# Patient Record
Sex: Male | Born: 1965 | Race: White | Hispanic: No | Marital: Married | Smoking: Never smoker
Health system: Southern US, Community
[De-identification: ages and names within clinical notes are randomized; demographics above are authoritative.]

## PROBLEM LIST (undated history)

## (undated) DIAGNOSIS — K4090 Unilateral inguinal hernia, without obstruction or gangrene, not specified as recurrent: Secondary | ICD-10-CM

## (undated) DIAGNOSIS — K219 Gastro-esophageal reflux disease without esophagitis: Secondary | ICD-10-CM

## (undated) DIAGNOSIS — C4491 Basal cell carcinoma of skin, unspecified: Secondary | ICD-10-CM

## (undated) DIAGNOSIS — J45909 Unspecified asthma, uncomplicated: Secondary | ICD-10-CM

## (undated) DIAGNOSIS — Z9889 Other specified postprocedural states: Secondary | ICD-10-CM

## (undated) DIAGNOSIS — G5603 Carpal tunnel syndrome, bilateral upper limbs: Secondary | ICD-10-CM

## (undated) DIAGNOSIS — I10 Essential (primary) hypertension: Secondary | ICD-10-CM

## (undated) DIAGNOSIS — J189 Pneumonia, unspecified organism: Secondary | ICD-10-CM

## (undated) HISTORY — DX: Basal cell carcinoma of skin, unspecified: C44.91

## (undated) HISTORY — PX: COLONOSCOPY: SHX174

## (undated) HISTORY — PX: SKIN CANCER EXCISION: SHX779

## (undated) HISTORY — PX: CATARACT EXTRACTION: SUR2

## (undated) HISTORY — PX: HERNIA REPAIR: SHX51

## (undated) HISTORY — DX: Other specified postprocedural states: Z98.890

## (undated) HISTORY — DX: Unilateral inguinal hernia, without obstruction or gangrene, not specified as recurrent: K40.90

---

## 2018-10-30 DIAGNOSIS — U071 COVID-19: Secondary | ICD-10-CM

## 2018-10-30 HISTORY — DX: COVID-19: U07.1

## 2019-05-06 ENCOUNTER — Encounter: Payer: Self-pay | Admitting: Emergency Medicine

## 2019-05-06 ENCOUNTER — Other Ambulatory Visit: Payer: Self-pay

## 2019-05-06 DIAGNOSIS — R55 Syncope and collapse: Secondary | ICD-10-CM | POA: Diagnosis present

## 2019-05-06 DIAGNOSIS — I10 Essential (primary) hypertension: Secondary | ICD-10-CM | POA: Diagnosis not present

## 2019-05-06 DIAGNOSIS — Z85828 Personal history of other malignant neoplasm of skin: Secondary | ICD-10-CM | POA: Insufficient documentation

## 2019-05-06 DIAGNOSIS — J45909 Unspecified asthma, uncomplicated: Secondary | ICD-10-CM | POA: Diagnosis not present

## 2019-05-06 DIAGNOSIS — M545 Low back pain: Secondary | ICD-10-CM | POA: Diagnosis not present

## 2019-05-06 LAB — URINALYSIS, COMPLETE (UACMP) WITH MICROSCOPIC
Bacteria, UA: NONE SEEN
Bilirubin Urine: NEGATIVE
Glucose, UA: NEGATIVE mg/dL
Hgb urine dipstick: NEGATIVE
Ketones, ur: NEGATIVE mg/dL
Leukocytes,Ua: NEGATIVE
Nitrite: NEGATIVE
Protein, ur: 30 mg/dL — AB
Specific Gravity, Urine: 1.021 (ref 1.005–1.030)
Squamous Epithelial / HPF: NONE SEEN (ref 0–5)
pH: 7 (ref 5.0–8.0)

## 2019-05-06 LAB — BASIC METABOLIC PANEL
Anion gap: 8 (ref 5–15)
BUN: 15 mg/dL (ref 6–20)
CO2: 28 mmol/L (ref 22–32)
Calcium: 9.2 mg/dL (ref 8.9–10.3)
Chloride: 106 mmol/L (ref 98–111)
Creatinine, Ser: 1.2 mg/dL (ref 0.61–1.24)
GFR calc Af Amer: >60 mL/min (ref >60)
GFR calc non Af Amer: >60 mL/min (ref >60)
Glucose, Bld: 102 mg/dL — ABNORMAL HIGH (ref 70–99)
Potassium: 4.1 mmol/L (ref 3.5–5.1)
Sodium: 142 mmol/L (ref 135–145)

## 2019-05-06 LAB — CBC
HCT: 44.5 % (ref 39.0–52.0)
Hemoglobin: 14.8 g/dL (ref 13.0–17.0)
MCH: 29.2 pg (ref 26.0–34.0)
MCHC: 33.3 g/dL (ref 30.0–36.0)
MCV: 87.9 fL (ref 80.0–100.0)
Platelets: 254 10*3/uL (ref 150–400)
RBC: 5.06 MIL/uL (ref 4.22–5.81)
RDW: 12.5 % (ref 11.5–15.5)
WBC: 15.2 10*3/uL — ABNORMAL HIGH (ref 4.0–10.5)
nRBC: 0 % (ref 0.0–0.2)

## 2019-05-06 LAB — TROPONIN I (HIGH SENSITIVITY): Troponin I (High Sensitivity): 3 ng/L (ref <18)

## 2019-05-06 NOTE — ED Triage Notes (Addendum)
Patient to ER for c/o lower back pain as result of fall with syncopal episode at approx 1730. Patient had scab on face from cancer area removal that he peeled off, then had syncopal episode. Patient reports h/o back pain, but states it is much more now after fall. Patient denies hitting head at time of incident. Denies any neck or head pain. Denies any pain other than lower back. Patient states he did have some chest pain just prior to syncopal episode, none currently. Denies any dizziness.

## 2019-05-07 ENCOUNTER — Emergency Department: Payer: Managed Care, Other (non HMO)

## 2019-05-07 ENCOUNTER — Emergency Department
Admission: EM | Admit: 2019-05-07 | Discharge: 2019-05-07 | Disposition: A | Discharge status: Home / Self Care | Payer: Managed Care, Other (non HMO) | Attending: Emergency Medicine | Admitting: Emergency Medicine

## 2019-05-07 DIAGNOSIS — M545 Low back pain, unspecified: Secondary | ICD-10-CM

## 2019-05-07 DIAGNOSIS — R55 Syncope and collapse: Secondary | ICD-10-CM

## 2019-05-07 HISTORY — DX: Gastro-esophageal reflux disease without esophagitis: K21.9

## 2019-05-07 HISTORY — DX: Essential (primary) hypertension: I10

## 2019-05-07 HISTORY — DX: Unspecified asthma, uncomplicated: J45.909

## 2019-05-07 MED ORDER — DIAZEPAM 2 MG PO TABS
2.0000 mg | ORAL_TABLET | Freq: Once | ORAL | Status: AC
  Administered 2019-05-07: 03:00:00 2 mg via ORAL
  Filled 2019-05-07 (×2): qty 1

## 2019-05-07 MED ORDER — IBUPROFEN 800 MG PO TABS: 800.0000 mg | ORAL_TABLET | Freq: Three times a day (TID) | ORAL | 0 refills | Status: DC | PRN

## 2019-05-07 MED ORDER — ONDANSETRON HCL 4 MG/2ML IJ SOLN
4.0000 mg | Freq: Once | INTRAMUSCULAR | Status: AC
  Administered 2019-05-07: 4 mg via INTRAVENOUS
  Filled 2019-05-07 (×2): qty 2

## 2019-05-07 MED ORDER — DIAZEPAM 2 MG PO TABS: 2.0000 mg | ORAL_TABLET | Freq: Three times a day (TID) | ORAL | 0 refills | Status: DC | PRN

## 2019-05-07 MED ORDER — OXYCODONE-ACETAMINOPHEN 5-325 MG PO TABS: 1.0000 | ORAL_TABLET | ORAL | 0 refills | Status: DC | PRN

## 2019-05-07 MED ORDER — SODIUM CHLORIDE 0.9 % IV BOLUS
1000.0000 mL | Freq: Once | INTRAVENOUS | Status: AC
  Administered 2019-05-07: 03:00:00 1000 mL via INTRAVENOUS

## 2019-05-07 MED ORDER — FENTANYL CITRATE (PF) 100 MCG/2ML IJ SOLN
50.0000 ug | Freq: Once | INTRAMUSCULAR | Status: AC
  Administered 2019-05-07: 03:00:00 50 ug via INTRAVENOUS
  Filled 2019-05-07 (×2): qty 2

## 2019-05-07 NOTE — ED Provider Notes (Signed)
[facility] Emergency Department Provider Note   ____________________________________________   First MD Initiated Contact with Patient 05/07/19 0231     (approximate)  I have reviewed the triage vital signs and the nursing notes.   HISTORY  Chief Complaint Loss of Consciousness    HPI Dennis JumpGregory Knapp is a 53 y.o. male who presents to the ED from home with a chief complaint of syncope/fall with lower back pain.  Patient has a history of hypertension, GERD, asthma who had Mohs surgery to his left face 7/6.  He had finished showering and was attempting to shave and remove his facial bandage when he felt dizzy, sat on the commode and had a syncopal episode.  Girlfriend heard him fall and found him on the restroom floor.  Patient denies striking his head at the time.  Initially did state he had some chest pain prior to the syncopal episode but has not had any since.  Only complains of low back pain.  Reports history of back pain but states low back hurts acutely.  Denies fever, cough, shortness of breath, abdominal pain, nausea, vomiting or dizziness.  Denies anticoagulant use.  Denies recent travel or exposure to persons diagnosed with coronavirus.       Past Medical History:  Diagnosis Date  . Asthma   . GERD (gastroesophageal reflux disease)   . Hypertension     There are no active problems to display for this patient.   Past Surgical History:  Procedure Laterality Date  . CATARACT EXTRACTION Right   . HERNIA REPAIR    . SKIN CANCER EXCISION     nose    Prior to Admission medications   Not on File    Allergies Patient has no known allergies.  No family history on file.  Social History Social History   Tobacco Use  . Smoking status: Never Smoker  . Smokeless tobacco: Never Used  Substance Use Topics  . Alcohol use: Yes    Comment: rarely  . Drug use: Not on file    Review of Systems  Constitutional: No fever/chills Eyes: No visual  changes. ENT: No sore throat. Cardiovascular: Positive for chest pain. Respiratory: Denies shortness of breath. Gastrointestinal: No abdominal pain.  No nausea, no vomiting.  No diarrhea.  No constipation. Genitourinary: Negative for dysuria. Musculoskeletal: Positive for for back pain. Skin: Negative for rash. Neurological: Positive for syncope.  Negative for headaches, focal weakness or numbness.   ____________________________________________   PHYSICAL EXAM:  VITAL SIGNS: ED Triage Vitals  Enc Vitals Group     BP 05/06/19 1945 126/85     Pulse Rate 05/06/19 1945 68     Resp 05/06/19 1945 18     Temp 05/06/19 1945 99.4 F (37.4 C)     Temp Source 05/06/19 1945 Oral     SpO2 05/06/19 1945 98 %     Weight 05/06/19 1940 187 lb (84.8 kg)     Height 05/06/19 1940 5\' 8"  (1.727 m)     Head Circumference --      Peak Flow --      Pain Score 05/06/19 1939 7     Pain Loc --      Pain Edu? --      Excl. in GC? --     Constitutional: Alert and oriented. Well appearing and in no acute distress. Eyes: Conjunctivae are normal. PERRL. EOMI. Head: Atraumatic.  Left periorbital and cheek bruising status post Mohs procedure. Nose: Atraumatic. Mouth/Throat: Mucous membranes  are moist.  No dental malocclusion. Neck: No stridor.  No cervical spine tenderness to palpation.  No carotid bruits. Cardiovascular: Normal rate, regular rhythm. Grossly normal heart sounds.  Good peripheral circulation. Respiratory: Normal respiratory effort.  No retractions. Lungs CTAB. Gastrointestinal: Soft and nontender. No distention. No abdominal bruits. No CVA tenderness. Musculoskeletal: Lumbar spine tender to palpation with her spinal muscle spasms.  Limited range of motion secondary to pain.  No lower extremity tenderness nor edema.  No joint effusions. Neurologic:  Normal speech and language. No gross focal neurologic deficits are appreciated.  Skin:  Skin is warm, dry and intact. No rash noted.  Psychiatric: Mood and affect are normal. Speech and behavior are normal.  ____________________________________________   LABS (all labs ordered are listed, but only abnormal results are displayed)  Labs Reviewed  BASIC METABOLIC PANEL - Abnormal; Notable for the following components:      Result Value   Glucose, Bld 102 (*)    All other components within normal limits  CBC - Abnormal; Notable for the following components:   WBC 15.2 (*)    All other components within normal limits  URINALYSIS, COMPLETE (UACMP) WITH MICROSCOPIC - Abnormal; Notable for the following components:   Color, Urine YELLOW (*)    APPearance HAZY (*)    Protein, ur 30 (*)    All other components within normal limits  TROPONIN I (HIGH SENSITIVITY)   ____________________________________________  EKG  ED ECG REPORT I, Kelon Easom J, the attending physician, personally viewed and interpreted this ECG.   Date: 05/07/2019  EKG Time: 1940  Rate: 63  Rhythm: normal EKG, normal sinus rhythm  Axis: Normal  Intervals:none  ST&T Change: Nonspecific  ____________________________________________  RADIOLOGY  ED MD interpretation: No ICH, degenerative change in lumbar spine without fracture  Official radiology report(s): Dg Lumbar Spine Complete  Result Date: 05/07/2019 CLINICAL DATA:  Lumbosacral back pain after fall. EXAM: LUMBAR SPINE - COMPLETE 4+ VIEW COMPARISON:  None. FINDINGS: Minimal retrolisthesis of L4 on L5, likely degenerative. Alignment is otherwise maintained. Vertebral body heights are normal. The posterior elements are intact. Multilevel endplate spurring. Mild disc space narrowing at L1-L2 and L2-L3. Facet hypertrophy in the lower lumbar spine. No fracture. Sacroiliac joints are congruent. Mild sclerosis about left sacroiliac joint. Prior hernia repair. 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 Electronically Signed   By: Narda RutherfordMelanie  Sanford M.D.   On: 05/07/2019  02:54   Ct Head Wo Contrast  Result Date: 05/07/2019 CLINICAL DATA:  Syncope. Fall. EXAM: CT HEAD WITHOUT CONTRAST TECHNIQUE: Contiguous axial images were obtained from the base of the skull through the vertex without intravenous contrast. COMPARISON:  None. FINDINGS: Brain: No intracranial hemorrhage, mass effect, or midline shift. No hydrocephalus. The basilar cisterns are patent. No evidence of territorial infarct or acute ischemia. No extra-axial or intracranial fluid collection. Vascular: Hyperdense vessel. Skull: No fracture or focal lesion. Sinuses/Orbits: Paranasal sinuses and mastoid air cells are clear. The visualized orbits are unremarkable. Other: None. IMPRESSION: Negative head CT. Electronically Signed   By: Narda RutherfordMelanie  Sanford M.D.   On: 05/07/2019 03:05    ____________________________________________   PROCEDURES  Procedure(s) performed (including Critical Care):  Procedures   ____________________________________________   INITIAL IMPRESSION / ASSESSMENT AND PLAN / ED COURSE  As part of my medical decision making, I reviewed the following data within the electronic MEDICAL RECORD NUMBER Nursing notes reviewed and incorporated, Labs reviewed, EKG interpreted, Old chart reviewed, Radiograph reviewed and Notes from prior ED  visits     Daylin Gruszka was evaluated in Emergency Department on 05/07/2019 for the symptoms described in the history of present illness. He was evaluated in the context of the global COVID-19 pandemic, which necessitated consideration that the patient might be at risk for infection with the SARS-CoV-2 virus that causes COVID-19. Institutional protocols and algorithms that pertain to the evaluation of patients at risk for COVID-19 are in a state of rapid change based on information released by regulatory bodies including the CDC and federal and state organizations. These policies and algorithms were followed during the patient's care in the ED.   53 year old male who  presents status post syncopal episode.  Differential diagnosis includes but is not limited to [place], CVA, vasovagal, infectious, metabolic etiologies, etc.  Laboratory and urine results noted. Will obtain CT Head and LS xrays. Initiate IV fluids and analgesia.   Clinical Course as of May 06 352  Wed May 07, 2019  0344 Updated patient of imaging results.  He is currently sitting up, eating a sandwich tray and feeling overall much better.  Will discharge home on NSAIDs, analgesia and muscle relaxer.  Strict return precautions given.  Patient verbalizes understanding and agrees with plan of care.   [JS]    Clinical Course User Index [JS] Paulette Blanch, MD     ____________________________________________   FINAL CLINICAL IMPRESSION(S) / ED DIAGNOSES  Final diagnoses:  Syncope, unspecified syncope type  Acute midline low back pain without sciatica     ED Discharge Orders    None       Note:  This document was prepared using Dragon voice recognition software and may include unintentional dictation errors.   Paulette Blanch, MD 05/07/19 [phone]

## 2019-05-07 NOTE — Discharge Instructions (Addendum)
You may take medicines as needed for pain & muscle spasms (Motrin/Percocet/Valium #15). Apply moist heat to affected area several times daily. Return to the ER for worsening symptoms, persistent vomiting, difficulty breathing or other concerns.

## 2020-05-22 IMAGING — CT CT HEAD WITHOUT CONTRAST
3 series · 16 of 46 positions shown, 19 images · non-contrast
Comparison: None.

CLINICAL DATA: Syncope. Fall.

EXAM:
CT HEAD WITHOUT CONTRAST
TECHNIQUE: Contiguous axial images were obtained from the base of the skull
through the vertex without intravenous contrast.

[Series 2: head wo · axial · 0.40mm/px · z∈[-85,+35]mm · 10 of 29 slices shown, 13 images]
[im 3/29  brain]
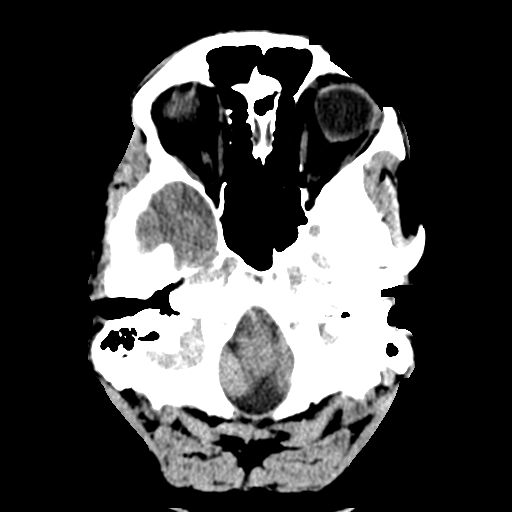
[im 3/29  bone]
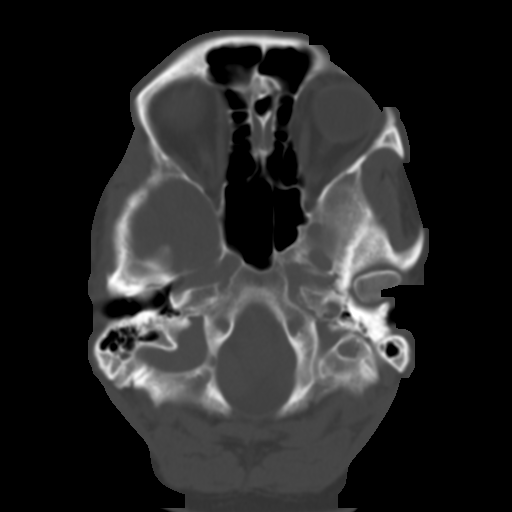
[im 6/29  brain]
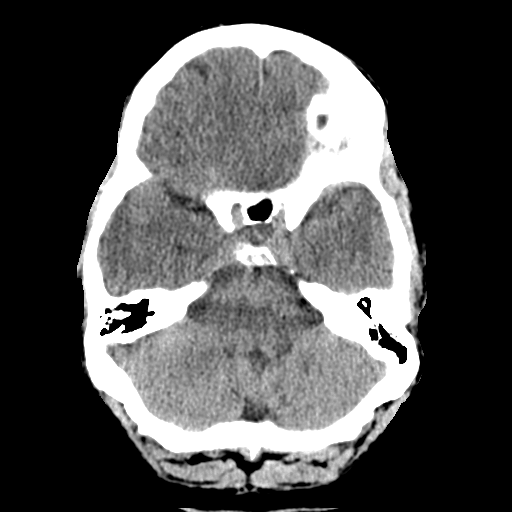
[im 8/29  brain]
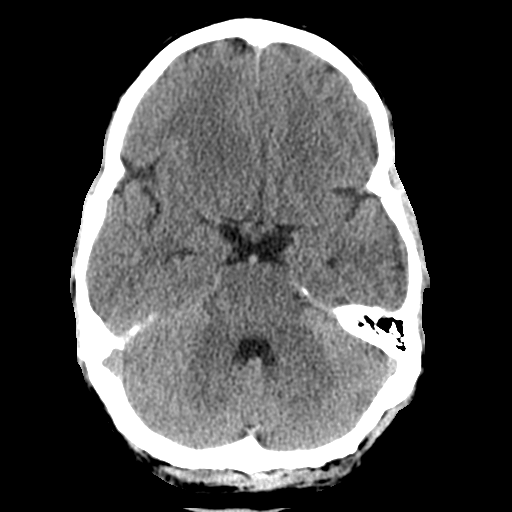
[im 11/29  brain]
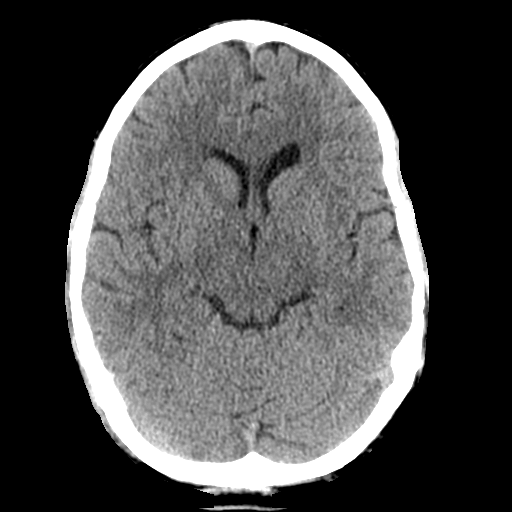
[im 14/29  brain]
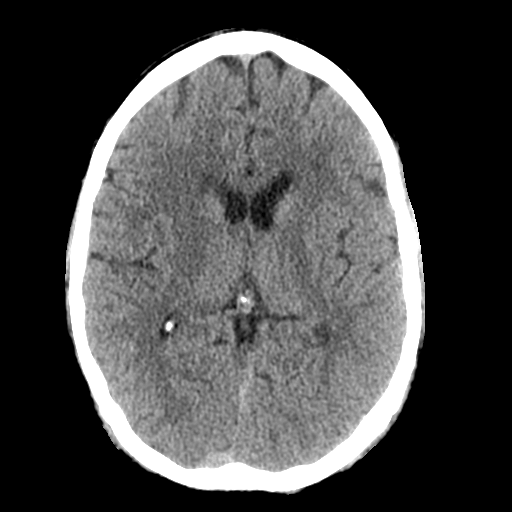
[im 14/29  bone]
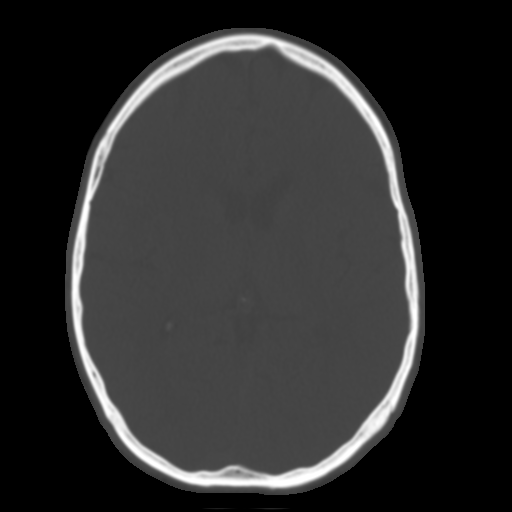
[im 16/29  brain]
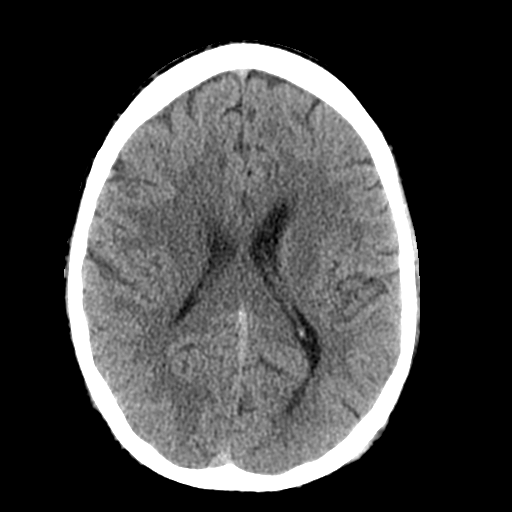
[im 19/29  brain]
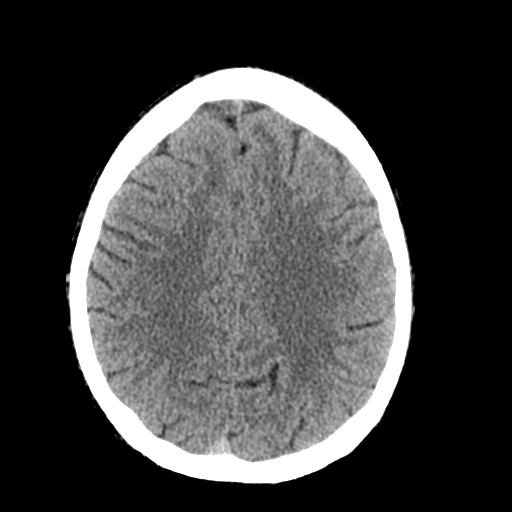
[im 22/29  brain]
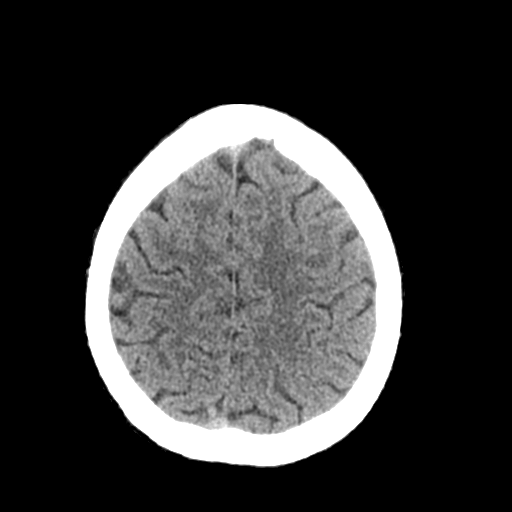
[im 24/29  brain]
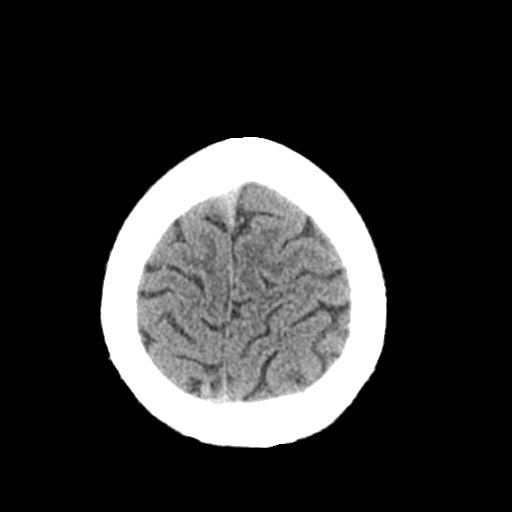
[im 24/29  bone]
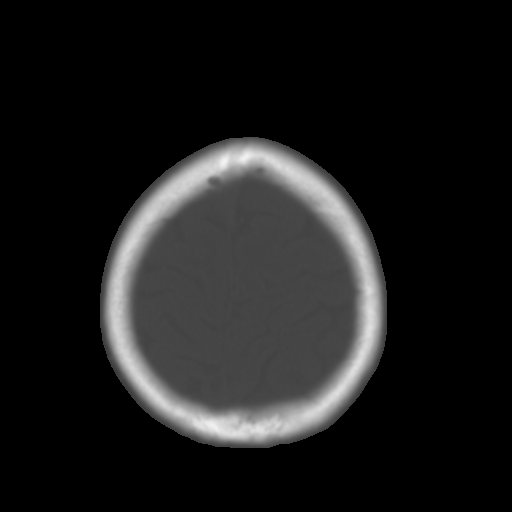
[im 27/29  brain]
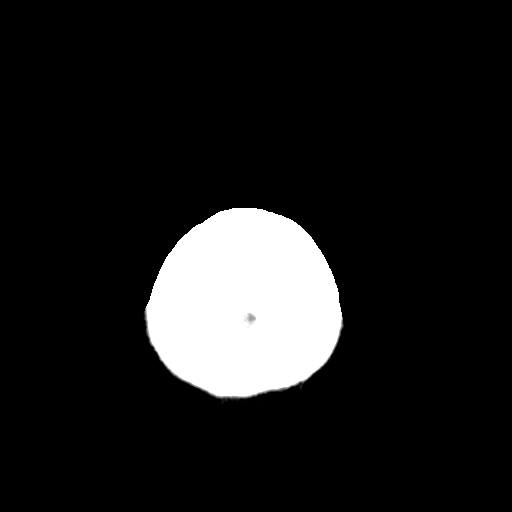

[Series 4: coronal soft tissue · coronal · 0.29mm/px · 3 of 64 slices shown]
[im 22/64  brain]
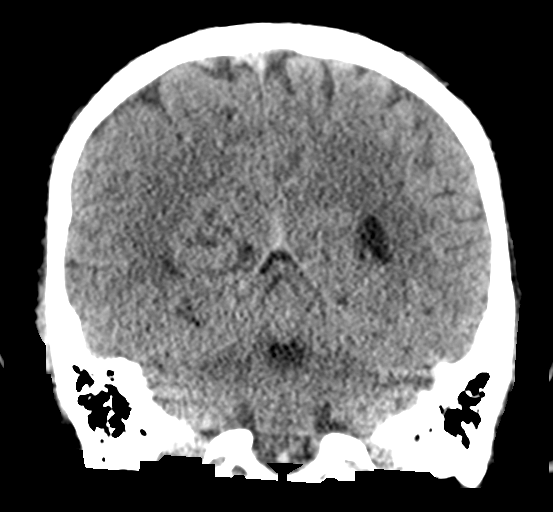
[im 29/64  brain]
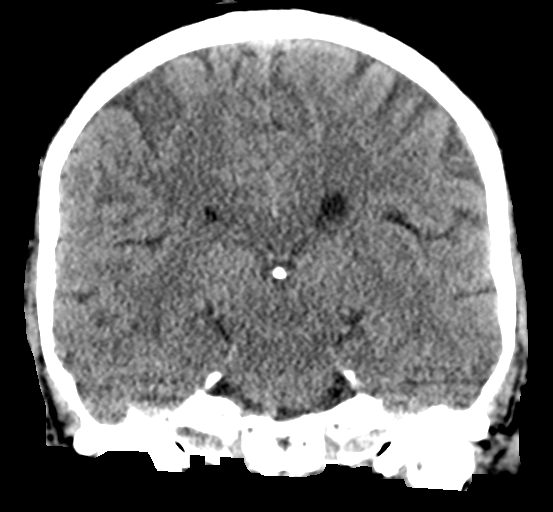
[im 36/64  brain]
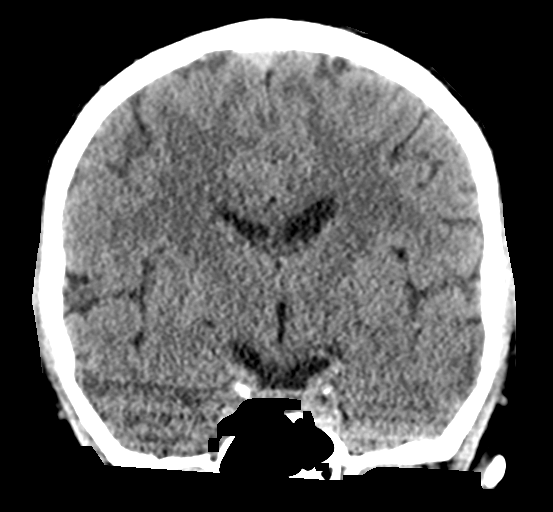

[Series 5: sagittal soft tissue · sagittal · 0.29mm/px · 3 of 50 slices shown]
[im 17/50  brain]
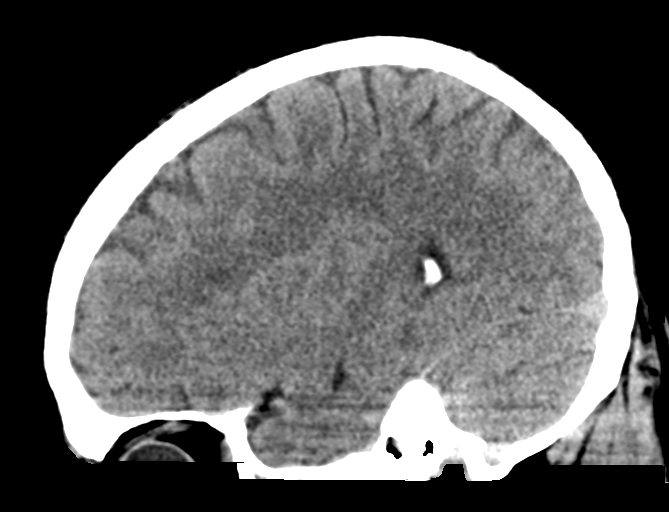
[im 25/50  brain]
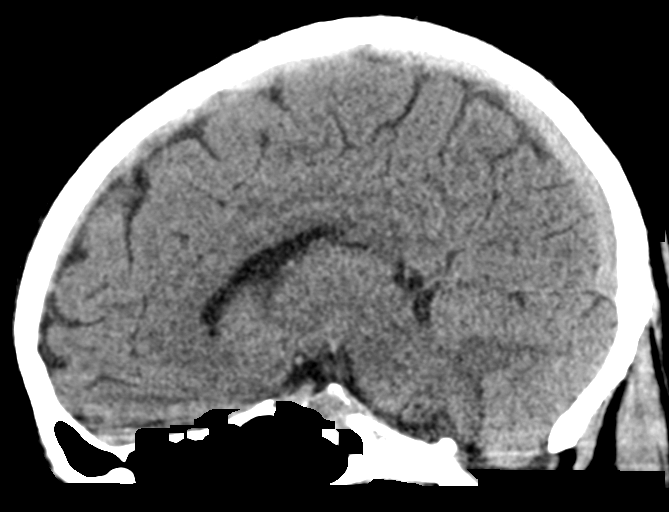
[im 33/50  brain]
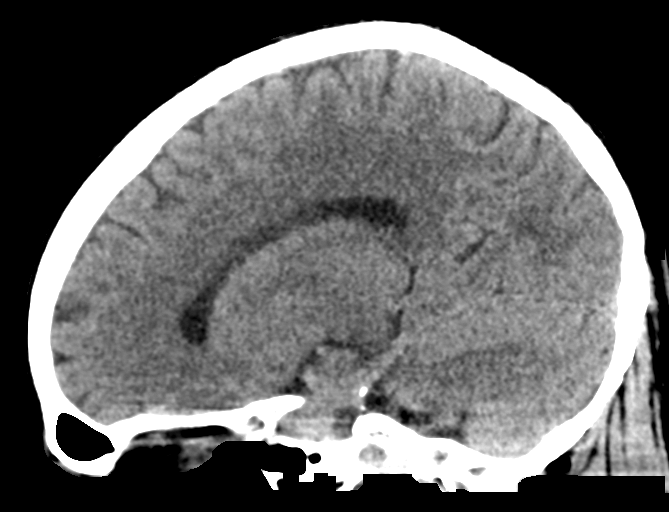

[16 of 46 positions shown; findings below may reference images not displayed]

FINDINGS: Brain: No intracranial hemorrhage, mass effect, or midline shift. No
hydrocephalus. The basilar cisterns are patent. No evidence of
territorial infarct or acute ischemia. No extra-axial or
intracranial fluid collection.

Vascular: Hyperdense vessel.

Skull: No fracture or focal lesion.

Sinuses/Orbits: Paranasal sinuses and mastoid air cells are clear.
The visualized orbits are unremarkable.

Other: None.
IMPRESSION: Negative head CT.

## 2021-04-13 ENCOUNTER — Ambulatory Visit: Payer: Managed Care, Other (non HMO) | Admitting: Dermatology

## 2021-05-11 ENCOUNTER — Ambulatory Visit: Payer: Managed Care, Other (non HMO) | Admitting: Dermatology

## 2023-08-14 ENCOUNTER — Emergency Department: Payer: BC Managed Care – PPO

## 2023-08-14 ENCOUNTER — Emergency Department: Payer: BLUE CROSS/BLUE SHIELD

## 2023-08-14 ENCOUNTER — Emergency Department
Admission: EM | Admit: 2023-08-14 | Discharge: 2023-08-15 | Disposition: A | Discharge status: Home / Self Care | Payer: BC Managed Care – PPO | Attending: Emergency Medicine | Admitting: Emergency Medicine

## 2023-08-14 ENCOUNTER — Other Ambulatory Visit: Payer: Self-pay

## 2023-08-14 DIAGNOSIS — R110 Nausea: Secondary | ICD-10-CM | POA: Insufficient documentation

## 2023-08-14 DIAGNOSIS — R5383 Other fatigue: Secondary | ICD-10-CM | POA: Insufficient documentation

## 2023-08-14 DIAGNOSIS — R1030 Lower abdominal pain, unspecified: Secondary | ICD-10-CM | POA: Diagnosis not present

## 2023-08-14 DIAGNOSIS — M5412 Radiculopathy, cervical region: Secondary | ICD-10-CM | POA: Diagnosis not present

## 2023-08-14 DIAGNOSIS — N410 Acute prostatitis: Secondary | ICD-10-CM | POA: Insufficient documentation

## 2023-08-14 DIAGNOSIS — R200 Anesthesia of skin: Secondary | ICD-10-CM | POA: Diagnosis present

## 2023-08-14 LAB — CBC
HCT: 46.2 % (ref 39.0–52.0)
Hemoglobin: 15.3 g/dL (ref 13.0–17.0)
MCH: 28.9 pg (ref 26.0–34.0)
MCHC: 33.1 g/dL (ref 30.0–36.0)
MCV: 87.3 fL (ref 80.0–100.0)
Platelets: 203 10*3/uL (ref 150–400)
RBC: 5.29 MIL/uL (ref 4.22–5.81)
RDW: 12.4 % (ref 11.5–15.5)
WBC: 14.9 10*3/uL — ABNORMAL HIGH (ref 4.0–10.5)
nRBC: 0 % (ref 0.0–0.2)

## 2023-08-14 LAB — DIFFERENTIAL
Abs Immature Granulocytes: 0.1 10*3/uL — ABNORMAL HIGH (ref 0.00–0.07)
Basophils Absolute: 0.1 10*3/uL (ref 0.0–0.1)
Basophils Relative: 1 %
Eosinophils Absolute: 0.2 10*3/uL (ref 0.0–0.5)
Eosinophils Relative: 2 %
Immature Granulocytes: 1 %
Lymphocytes Relative: 10 %
Lymphs Abs: 1.6 10*3/uL (ref 0.7–4.0)
Monocytes Absolute: 0.6 10*3/uL (ref 0.1–1.0)
Monocytes Relative: 4 %
Neutro Abs: 12.3 10*3/uL — ABNORMAL HIGH (ref 1.7–7.7)
Neutrophils Relative %: 82 %

## 2023-08-14 LAB — COMPREHENSIVE METABOLIC PANEL
ALT: 20 U/L (ref 0–44)
AST: 21 U/L (ref 15–41)
Albumin: 3.9 g/dL (ref 3.5–5.0)
Alkaline Phosphatase: 69 U/L (ref 38–126)
Anion gap: 10 (ref 5–15)
BUN: 15 mg/dL (ref 6–20)
CO2: 23 mmol/L (ref 22–32)
Calcium: 8.6 mg/dL — ABNORMAL LOW (ref 8.9–10.3)
Chloride: 101 mmol/L (ref 98–111)
Creatinine, Ser: 1.18 mg/dL (ref 0.61–1.24)
GFR, Estimated: >60 mL/min (ref >60)
Glucose, Bld: 125 mg/dL — ABNORMAL HIGH (ref 70–99)
Potassium: 3.2 mmol/L — ABNORMAL LOW (ref 3.5–5.1)
Sodium: 134 mmol/L — ABNORMAL LOW (ref 135–145)
Total Bilirubin: 1.5 mg/dL — ABNORMAL HIGH (ref 0.3–1.2)
Total Protein: 7.7 g/dL (ref 6.5–8.1)

## 2023-08-14 LAB — LIPASE, BLOOD: Lipase: 22 U/L (ref 11–51)

## 2023-08-14 LAB — APTT: aPTT: 33 s (ref 24–36)

## 2023-08-14 LAB — PROTIME-INR
INR: 1.2 (ref 0.8–1.2)
Prothrombin Time: 15 s (ref 11.4–15.2)

## 2023-08-14 LAB — ETHANOL: Alcohol, Ethyl (B): <10 mg/dL (ref <10)

## 2023-08-14 MED ORDER — SODIUM CHLORIDE 0.9 % IV SOLN
2.0000 g | Freq: Once | INTRAVENOUS | Status: AC
  Administered 2023-08-14: 2 g via INTRAVENOUS
  Filled 2023-08-14: qty 20

## 2023-08-14 MED ORDER — LORAZEPAM 2 MG/ML IJ SOLN
1.0000 mg | Freq: Once | INTRAMUSCULAR | Status: AC
  Administered 2023-08-14: 1 mg via INTRAVENOUS
  Filled 2023-08-14: qty 1

## 2023-08-14 MED ORDER — CIPROFLOXACIN HCL 500 MG PO TABS: 500.0000 mg | ORAL_TABLET | Freq: Two times a day (BID) | ORAL | 0 refills | Status: AC

## 2023-08-14 MED ORDER — IOHEXOL 350 MG/ML SOLN
100.0000 mL | Freq: Once | INTRAVENOUS | Status: AC | PRN
  Administered 2023-08-14: 100 mL via INTRAVENOUS

## 2023-08-14 MED ORDER — LACTATED RINGERS IV BOLUS
1000.0000 mL | Freq: Once | INTRAVENOUS | Status: AC
  Administered 2023-08-14: 1000 mL via INTRAVENOUS

## 2023-08-14 MED ORDER — ONDANSETRON 4 MG PO TBDP: 4.0000 mg | ORAL_TABLET | Freq: Three times a day (TID) | ORAL | 0 refills | Status: DC | PRN

## 2023-08-14 MED ORDER — OXYCODONE-ACETAMINOPHEN 5-325 MG PO TABS
1.0000 | ORAL_TABLET | Freq: Four times a day (QID) | ORAL | 0 refills | Status: DC | PRN
Start: 2023-08-14 — End: 2023-08-22

## 2023-08-14 MED ORDER — KETOROLAC TROMETHAMINE 30 MG/ML IJ SOLN
15.0000 mg | Freq: Once | INTRAMUSCULAR | Status: AC
  Administered 2023-08-14: 15 mg via INTRAVENOUS
  Filled 2023-08-14: qty 1

## 2023-08-14 NOTE — ED Triage Notes (Signed)
Pt to ED for numbness to right arm started Sunday afternoon. Reports feels like having trouble gripping with right hand. +shob.  Clear speech, RR even and unlabored.

## 2023-08-14 NOTE — ED Provider Notes (Signed)
[facility] Provider Note    Event Date/Time   First MD Initiated Contact with Patient 08/14/23 1734     (approximate)   History   Numbness   HPI  Dennis Knapp is a 57 y.o. male here with right arm numbness, dysuria, fatigue, nausea.  Patient states his symptoms started over the last 48 hours with lower suprapubic pain, discomfort, urinary frequency.  He started taking over-the-counter medications for UTI which mildly helped his symptoms.  However, he has had progressively worsening generalized fatigue.  He has also noticed that over the last 24 hours, he has developed worsening right greater than left arm tingling and numbness.  He has a history of recurrent numbness in the right arm, which is often exacerbated by increased activity.  He sees a Land for this.  He has not had an MRI.  He states that symptoms currently feel very similar to that although slightly worse, and he did have some transient left arm symptoms although these are also now resolved.  Denies any lower extremity weakness.  Denies any major neck pain or trauma.  No vision changes.  No difficulty speaking or swallowing.     Physical Exam   Triage Vital Signs: ED Triage Vitals  Encounter Vitals Group     BP 08/14/23 1631 127/87     Systolic BP Percentile --      Diastolic BP Percentile --      Pulse Rate 08/14/23 1631 (!) 101     Resp 08/14/23 1631 18     Temp 08/14/23 1630 98.7 F (37.1 C)     Temp src --      SpO2 08/14/23 1631 98 %     Weight 08/14/23 1631 201 lb (91.2 kg)     Height 08/14/23 1631 5\' 9"  (1.753 m)     Head Circumference --      Peak Flow --      Pain Score 08/14/23 1631 0     Pain Loc --      Pain Education --      Exclude from Growth Chart --     Most recent vital signs: Vitals:   08/14/23 2242 08/15/23 0007  BP: 117/87 (!) 163/97  Pulse: 70 81  Resp: 17 17  Temp: 98.8 F (37.1 C) 98.3 F (36.8 C)  SpO2: 99% 97%     General: Awake, no  distress.  CV:  Good peripheral perfusion. RRR. No m/r/g. Resp:  Normal work of breathing. Lungs clear bilaterally. Abd:  No distention. Moderate suprapubic and LLQ TTP. No guarding or rebound. Other:  Subjectively diminished sensation throughout the right distal fingers 1 through 5.  Grip strength out of 5 bilaterally.  Normal wrist flexion and extension.  Patient able to cross both fingers on both sides.  Mild paraspinal neck pain of the cervical spine.  No major midline pain.  Reflexes 2+ and symmetric upper and lower extremities.  Strength 5 out of 5 normal sensation bilateral lower extremities.  Cranials 2 through 12 individually tested and are intact.   ED Results / Procedures / Treatments   Labs (all labs ordered are listed, but only abnormal results are displayed) Labs Reviewed  CBC - Abnormal; Notable for the following components:      Result Value   WBC 14.9 (*)    All other components within normal limits  DIFFERENTIAL - Abnormal; Notable for the following components:   Neutro Abs 12.3 (*)    Abs Immature Granulocytes  0.10 (*)    All other components within normal limits  COMPREHENSIVE METABOLIC PANEL - Abnormal; Notable for the following components:   Sodium 134 (*)    Potassium 3.2 (*)    Glucose, Bld 125 (*)    Calcium 8.6 (*)    Total Bilirubin 1.5 (*)    All other components within normal limits  URINE CULTURE  PROTIME-INR  APTT  ETHANOL  LIPASE, BLOOD     EKG Normal sinus rhythm, ventricular rate 100.  PR 134, QRS 88, QTc 464.  Nonspecific ST changes, no acute ST elevations or major depressions.   RADIOLOGY CT head: No acute intra abnormality CT C-spine: No acute traumatic listhesis CT abdomen/pelvis: Prominent small vesicles with mild surrounding inflammation worrisome for infection, mild prominence of the pancreatic duct, sigmoid colon diverticulosis,   I also independently reviewed and agree with radiologist  interpretations.   PROCEDURES:  Critical Care performed: No  .1-3 Lead EKG Interpretation  Performed by: Shaune Pollack, MD Authorized by: Shaune Pollack, MD     Interpretation: normal     ECG rate:  60-80   ECG rate assessment: normal     Rhythm: sinus rhythm     Ectopy: none     Conduction: normal   Comments:     Indication: Weakness     MEDICATIONS ORDERED IN ED: Medications  cefTRIAXone (ROCEPHIN) 2 g in sodium chloride 0.9 % 100 mL IVPB (0 g Intravenous Stopped 08/14/23 1845)  ketorolac (TORADOL) 30 MG/ML injection 15 mg (15 mg Intravenous Given 08/14/23 1827)  lactated ringers bolus 1,000 mL (0 mLs Intravenous Stopped 08/14/23 2045)  iohexol (OMNIPAQUE) 350 MG/ML injection 100 mL (100 mLs Intravenous Contrast Given 08/14/23 1833)  LORazepam (ATIVAN) injection 1 mg (1 mg Intravenous Given 08/14/23 2041)     IMPRESSION / MDM / ASSESSMENT AND PLAN / ED COURSE  I reviewed the triage vital signs and the nursing notes.                              Differential diagnosis includes, but is not limited to,   Abd pain: prostatitis, UTI, stone, diverticulitis, appendicitis Numbness: cervical radiculopathy, CVA, cord ischemia, mass/lesion  Patient's presentation is most consistent with acute presentation with potential threat to life or bodily function.  The patient is on the cardiac monitor to evaluate for evidence of arrhythmia and/or significant heart rate changes   57 yo M here with multiple complaints. Primary c/o lower abd pain is likely 2/2 prostatitis, possible UTI. CT A/P obtained, reviewed, shows seminal inflammation c/w this. No abscess noted. ? Of pancreatic duct dilatation - no h/o epigastric pain, vomiting, and LFTs, Bili, lipase are normal - can f/u as outpt. D/c with abx, outpt uro f/u.  Re: numbness- suspect cervical radiculopathy but CT Head, C Spine obtained and shows no major degen disease I this area. He has had weakness with this. Will check MR Brain,  C Spine to eval, hopefully dc with supportive care.  FINAL CLINICAL IMPRESSION(S) / ED DIAGNOSES   Final diagnoses:  Acute prostatitis  Cervical radiculopathy     Rx / DC Orders   ED Discharge Orders          Ordered    ciprofloxacin (CIPRO) 500 MG tablet  2 times daily        08/14/23 2318    oxyCODONE-acetaminophen (PERCOCET) 5-325 MG tablet  Every 6 hours PRN  08/14/23 2318    ondansetron (ZOFRAN-ODT) 4 MG disintegrating tablet  Every 8 hours PRN        08/14/23 2318             Note:  This document was prepared using Dragon voice recognition software and may include unintentional dictation errors.   Shaune Pollack, MD 08/15/23 1113

## 2023-08-14 NOTE — Discharge Instructions (Addendum)
Take an additional 4 days of Cipro with your 10 day course, for a total of 2 weeks.  Call Dr. Delana Meyer office for follow-up with a Urologist.  For your neck, your MRI showed C6-C7 moderate right and mild left neural foraminal narrowing and C4-C5 mild right neural foraminal narrowing. This could potentially contribute to symptoms of numbness and weakness in your arms but does not need emergent intervention.  Please follow up with Dr. Katrinka Blazing with neurosurgery.  You may alternate Tylenol 1000 mg every 6 hours as needed for pain, fever and Ibuprofen 800 mg every 6-8 hours as needed for pain, fever.  Please take Ibuprofen with food.  Do not take more than 4000 mg of Tylenol (acetaminophen) in a 24 hour period.

## 2023-08-15 NOTE — ED Provider Notes (Signed)
12:00 AM  Assumed care at shift change.  Patient here with symptoms of prostatitis.  He is already on Cipro for UTI.  Urinalysis on 08/13/2023 showed white blood cells, red blood cells, nitrites..  Previous provider recommends continuing antibiotics.  Will discharge with pain medication.  Patient also having intermittent right and left arm tingling, numbness, weakness.  No neck pain or headache.  MRIs were pending.  MRIs reviewed and interpreted by myself and the radiologist and show mild to moderate cervical foraminal narrowing at C6-C7 and C4-C5.  Discussed with patient that normally I would put him on a steroid taper for this but given his prostatitis, will avoid steroids at this time but have him follow-up with neurosurgery.  He has also been provided with urology follow-up information.  He does have a leukocytosis with left shift but otherwise does not appear septic.  Patient tolerating p.o. here.  He is comfortable with plan for discharge home with outpatient follow-up.   At this time, I do not feel there is any life-threatening condition present. I reviewed all nursing notes, vitals, pertinent previous records.  All lab and urine results, EKGs, imaging ordered have been independently reviewed and interpreted by myself.  I reviewed all available radiology reports from any imaging ordered this visit.  Based on my assessment, I feel the patient is safe to be discharged home without further emergent workup and can continue workup as an outpatient as needed. Discussed all findings, treatment plan as well as usual and customary return precautions.  They verbalize understanding and are comfortable with this plan.  Outpatient follow-up has been provided as needed.  All questions have been answered.    Channa Hazelett, Layla Maw, DO 08/15/23 0030

## 2023-08-17 NOTE — Progress Notes (Unsigned)
Referring Physician:  Mick Sell, MD [address] [place],  [place] [address]  Primary Physician:  Mick Sell, MD  History of Present Illness: 08/22/2023 Dennis Knapp has a history of HTN.   Seen in ED on 08/15/23 for prostatitis. Was also having intermittent right > left arm numbness, tingling, and weakness x 24 hours.   2 week history of constant numbness/tingling in both wrists and hands with burning in his fingers. Some weakness in his hands.  Symptoms in arms are worse at night and sitting. He has trouble lifting. He feels like arms are tired. He has intermittent neck pain that is tolerable.   He's had intermittent numbness/tingling in hands at night x 1 year. It has never lasted this long.   Conservative measures:  Physical therapy: has not participated in PT  Multimodal medical therapy including regular antiinflammatories: percocet 5  Injections: none epidural steroid injections  Past Surgery: none  Shiah Quale has no symptoms of cervical myelopathy.  The symptoms are causing a significant impact on the patient's life.   Review of Systems:  A 10 point review of systems is negative, except for the pertinent positives and negatives detailed in the HPI.  Past Medical History: Past Medical History:  Diagnosis Date   Asthma    GERD (gastroesophageal reflux disease)    Hypertension     Past Surgical History: Past Surgical History:  Procedure Laterality Date   CATARACT EXTRACTION Right    HERNIA REPAIR     SKIN CANCER EXCISION     nose    Allergies: Allergies as of 08/22/2023   (No Known Allergies)    Medications: Outpatient Encounter Medications as of 08/22/2023  Medication Sig   albuterol (VENTOLIN HFA) 108 (90 Base) MCG/ACT inhaler Inhale 2 puffs into the lungs every 6 (six) hours as needed.   ciprofloxacin (CIPRO) 500 MG tablet Take 500 mg by mouth 2 (two) times daily.   fluticasone-salmeterol (ADVAIR) 250-50 MCG/ACT AEPB  Inhale 1 puff into the lungs daily.   ibuprofen (ADVIL) 800 MG tablet Take 1 tablet (800 mg total) by mouth every 8 (eight) hours as needed for moderate pain.   lisinopril (ZESTRIL) 40 MG tablet Take 40 mg by mouth daily.   montelukast (SINGULAIR) 10 MG tablet Take 1 tablet by mouth at bedtime.   omeprazole (PRILOSEC) 40 MG capsule Take 40 mg by mouth daily.   [EXPIRED] ciprofloxacin (CIPRO) 500 MG tablet Take 1 tablet (500 mg total) by mouth 2 (two) times daily for 4 days.   [DISCONTINUED] diazepam (VALIUM) 2 MG tablet Take 1 tablet (2 mg total) by mouth every 8 (eight) hours as needed for muscle spasms.   [DISCONTINUED] ondansetron (ZOFRAN-ODT) 4 MG disintegrating tablet Take 1 tablet (4 mg total) by mouth every 8 (eight) hours as needed for nausea or vomiting.   [DISCONTINUED] oxyCODONE-acetaminophen (PERCOCET) 5-325 MG tablet Take 1-2 tablets by mouth every 6 (six) hours as needed for severe pain (pain score 7-10) or moderate pain (pain score 4-6) (no more than 6 tabs daily).   No facility-administered encounter medications on file as of 08/22/2023.    Social History: Social History   Tobacco Use   Smoking status: Never   Smokeless tobacco: Never  Substance Use Topics   Alcohol use: Yes    Comment: rarely    Family Medical History: History reviewed. No pertinent family history.  Physical Examination: Vitals:   08/22/23 0903 08/22/23 0935  BP: (!) 160/102 (!) 146/86  General: Patient is well developed, well nourished, calm, collected, and in no apparent distress. Attention to examination is appropriate.  Respiratory: Patient is breathing without any difficulty.   NEUROLOGICAL:     Awake, alert, oriented to person, place, and time.  Speech is clear and fluent. Fund of knowledge is appropriate.   Cranial Nerves: Pupils equal round and reactive to light.  Facial tone is symmetric.    No posterior cervical tenderness. No tenderness in bilateral trapezial region.   No  abnormal lesions on exposed skin.   Strength: Side Biceps Triceps Deltoid Interossei Grip Wrist Ext. Wrist Flex.  R 5 5 5 5 5 5 5   L 5 5 5 5 5 5 5    Side Iliopsoas Quads Hamstring PF DF EHL  R 5 5 5 5 5 5   L 5 5 5 5 5 5    Reflexes are 2+ and symmetric at the biceps, brachioradialis, patella and achilles.   Hoffman's is absent.  Clonus is not present.   Bilateral upper and lower extremity sensation is intact to light touch, but diminished in the hands bilaterally.   He has negative tinels at wrist and elbow bilaterally. He has positive phalens at wrist on right, negative on the left.   Gait is normal.     Medical Decision Making  Imaging: MRI cervical spine dated 08/14/23:  Alignment: Trace retrolisthesis of C6 on C7.   Vertebrae: No acute fracture, evidence of discitis, or suspicious osseous lesion.   Cord: Normal signal and morphology.   Posterior Fossa, vertebral arteries, paraspinal tissues: Negative.   Disc levels:   C2-C3: No significant disc bulge. No spinal canal stenosis or neuroforaminal narrowing.   C3-C4: Mild disc bulge. Uncovertebral hypertrophy. No spinal canal stenosis or neural foraminal narrowing.   C4-C5: No significant disc bulge. Facet and uncovertebral hypertrophy. No spinal canal stenosis. Mild right neural foraminal narrowing.   C5-C6: No significant disc bulge. No spinal canal stenosis or neuroforaminal narrowing.   C6-C7: Trace retrolisthesis and minimal disc bulge. Facet and uncovertebral hypertrophy. Mild left and moderate right neural foraminal narrowing.   C7-T1: No significant disc bulge. No spinal canal stenosis or neuroforaminal narrowing.   IMPRESSION: 1. No acute intracranial process. No evidence of acute or subacute infarct. 2. C6-C7 moderate right and mild left neural foraminal narrowing. 3. C4-C5 mild right neural foraminal narrowing. 4. No spinal canal stenosis.     Electronically Signed   By: Wiliam Ke M.D.    On: 08/14/2023 23:35   CT of cervical spine dated 08/14/23:  Alignment: No traumatic listhesis. Straightening and mild reversal of the normal cervical lordosis. Dextrocurvature of the cervical spine may be positional.   Skull base and vertebrae: No acute fracture or suspicious osseous lesion.   Soft tissues and spinal canal: No prevertebral fluid or swelling. No visible canal hematoma.   Disc levels: Degenerative changes in the cervical spine.No high-grade spinal canal stenosis. Multilevel facet and uncovertebral hypertrophy, which results in mild right neural foraminal narrowing at C6-C7.   Upper chest: Negative.   IMPRESSION: 1. No acute intracranial process. 2. No acute fracture or traumatic listhesis in the cervical spine.     Electronically Signed   By: Wiliam Ke M.D.   On: 08/14/2023 19:12    I have personally reviewed the images and agree with the above interpretation.  Assessment and Plan: Mr. Lindquist is a pleasant 57 y.o. male has 2 week history of constant numbness/tingling in both wrists and hands with burning in  his fingers. Some weakness in his hands.  He's had intermittent numbness/tingling in hands for over a year. Symptoms were never constant. He has intermittent neck pain that is tolerable.   He has known cervical spondylosis with mild left/moderate right foraminal stenosis C6-C7 with trace retrolisthesis. Symptoms in hands may be cervical mediated, but are also suspicious for carpal tunnel syndrome.   Treatment options discussed with patient and following plan made:   - Cervical xrays with flexion/extension views ordered.  - EMG of bilateral upper extremities to evaluate cervical radiculopathy versus carpal tunnel. Orders to [facility].  - Symptoms worse at night. Can try OTC carpal tunnel splints.  - Discussed trial of neurontin. He declines for now.  - Will likely have him follow up with Dr. Katrinka Blazing depending on results of above.   BP was  elevated. No symptoms of chest pain, shortness of breath, blurry vision, or headaches. He checks BP at home and it generally runs lower, but has been higher than normal lately. Will recheck at home and call PCP if not improved. If he develops CP, SOB, blurry vision, or headaches, then he will go to ED.     I spent a total of 35 minutes in face-to-face and non-face-to-face activities related to this patient's care today including review of outside records, review of imaging, review of symptoms, physical exam, discussion of differential diagnosis, discussion of treatment options, and documentation.   Thank you for involving me in the care of this patient.   Drake Leach PA-C Dept. of Neurosurgery

## 2023-08-22 ENCOUNTER — Ambulatory Visit
Admission: RE | Admit: 2023-08-22 | Discharge: 2023-08-22 | Disposition: A | Discharge status: Home / Self Care | Payer: BC Managed Care – PPO | Source: Ambulatory Visit | Attending: Orthopedic Surgery | Admitting: Orthopedic Surgery

## 2023-08-22 ENCOUNTER — Encounter: Payer: Self-pay | Admitting: Orthopedic Surgery

## 2023-08-22 ENCOUNTER — Ambulatory Visit: Payer: BC Managed Care – PPO | Admitting: Orthopedic Surgery

## 2023-08-22 ENCOUNTER — Ambulatory Visit
Admission: RE | Admit: 2023-08-22 | Discharge: 2023-08-22 | Disposition: A | Discharge status: Home / Self Care | Payer: BC Managed Care – PPO | Attending: Orthopedic Surgery | Admitting: Orthopedic Surgery

## 2023-08-22 VITALS — BP 146/86 | Ht 69.0 in | Wt 199.0 lb

## 2023-08-22 DIAGNOSIS — R200 Anesthesia of skin: Secondary | ICD-10-CM | POA: Diagnosis not present

## 2023-08-22 DIAGNOSIS — M4802 Spinal stenosis, cervical region: Secondary | ICD-10-CM

## 2023-08-22 DIAGNOSIS — M47812 Spondylosis without myelopathy or radiculopathy, cervical region: Secondary | ICD-10-CM | POA: Diagnosis not present

## 2023-08-22 DIAGNOSIS — R202 Paresthesia of skin: Secondary | ICD-10-CM

## 2023-08-22 NOTE — Patient Instructions (Addendum)
It was so nice to see you today. Thank you so much for coming in.    You have some wear and tear in your neck (arthritis), but I think the numbness/tingling in your hands may be from carpal tunnel.   I ordered xrays of your neck. You can get these at [facility] (building with the white pillars) off of [address]. The address is [address], [place], [place] [address]. You do not need any appointment. I will message you with results- it can take 10-14 days for me to get the results. You will see them on MyChart the same time I do.   You can try getting an over the counter carpal tunnel braces to wear at night. This may help with numbness/tingling.   We can try adding a medication like neurontin/gabapentin at night to help with symptoms. Let me know if you want to do this.   I want to get an EMG (nerve conduction test) to look into things further. I have ordered this and [facility] will call you to schedule. You can also call them at [phone].   Once I get the EMG results back, we will decide on your follow up.   Your blood pressure was elevated today. I want you to recheck it at home and follow up with your PCP if it remains high. If you have any chest pain, shortness of breath, blurry vision, or headaches then you need to go to ED.    Please do not hesitate to call if you have any questions or concerns. You can also message me in MyChart.   Drake Leach PA-C [phone]     The physicians and staff at [facility] at [facility] are committed to providing excellent care. You may receive a survey asking for feedback about your experience at our office. We value you your feedback and appreciate you taking the time to to fill it out. The [facility] leadership team is also available to discuss your experience in person, feel free to contact us [phone].

## 2023-08-27 ENCOUNTER — Other Ambulatory Visit: Payer: Self-pay

## 2023-08-27 ENCOUNTER — Encounter: Payer: Self-pay | Admitting: Neurology

## 2023-08-27 DIAGNOSIS — R202 Paresthesia of skin: Secondary | ICD-10-CM

## 2023-08-28 ENCOUNTER — Encounter: Payer: Self-pay | Admitting: Orthopedic Surgery

## 2023-08-28 ENCOUNTER — Ambulatory Visit: Payer: BC Managed Care – PPO | Admitting: Neurology

## 2023-08-28 DIAGNOSIS — G5603 Carpal tunnel syndrome, bilateral upper limbs: Secondary | ICD-10-CM

## 2023-08-28 DIAGNOSIS — R202 Paresthesia of skin: Secondary | ICD-10-CM | POA: Diagnosis not present

## 2023-08-28 DIAGNOSIS — M5412 Radiculopathy, cervical region: Secondary | ICD-10-CM

## 2023-08-28 NOTE — Telephone Encounter (Signed)
EMG of bilateral upper extremities dated 08/28/23:   Impression: This is an abnormal study. The findings are most consistent with the following: Evidence of bilateral median mononeuropathy at or distal to the wrist, consistent with carpal tunnel syndrome. Findings are mild to moderate in degree electrically bilaterally. Ultrasound confirms increased wrist to forearm ratios bilaterally. There are no compressive lesions or structural abnormalities seen on ultrasound. The residuals of an old intraspinal canal lesion (ie: motor radiculopathy) at the left C7 root or segment, mild in degree electrically. No electrodiagnostic evidence of a right cervical (C5-C8) motor radiculopathy. Screening studies for right or left ulnar or radial mononeuropathies are normal.     ___________________________ Jacquelyne Balint, MD

## 2023-08-28 NOTE — Procedures (Signed)
[facility]  [address] [place], [address]  [place], [place] [address] Tel: [phone] Fax: [phone] Test Date:  08/28/2023  Patient: Dennis Knapp DOB: 1966/10/26 Physician: Jacquelyne Balint, MD  Sex: Male Height: 5\' 9"  Ref Phys: Lonn Georgia, PA-C  ID#: [id number]   Technician:    History: This is a 57 year old male with numbness, tingling, and weakness of hands.  NCV & EMG Findings: Extensive electrodiagnostic evaluation of bilateral upper limbs shows: Bilateral median sensory responses show prolonged distal peak latency (L4.1, R3.7 ms) and reduced amplitude (L12, R11 V). Bilateral ulnar and radial sensory responses are within normal limits. Bilateral median (APB) and ulnar (ADM) motor responses are within normal limits. Chronic motor axon loss changes without accompanying active denervation changes are seen in the left pronator teres and triceps muscles.  Evaluation of the left median sensory and the right median sensory nerves showed prolonged distal peak latency (L4.1, R3.7 ms) and reduced amplitude (L12, R11 V).  All remaining nerves (as indicated in the following tables) were within normal limits.  All left vs. right side differences were within normal limits.    The muscle scoring table definition stored in the current test does not match the sentence generator setup.  The sentence could not be generated.  Neuromuscular ultrasound findings: High frequency (4.0-16.0 MHz) B-mode, nonvascular ultrasound of the bilateral upper limbs shows: Cross sectional areas (CSA) of bilateral median (palm to forearm) nerves are within normal limits. Wrist to forearm ratios of bilateral median nerves are increased bilaterally (L2.49, R2.60). No other obvious lesion involving the adjacent bone or tendon is identified. No definite vascular abnormalities.  Impression: This is an abnormal study. The findings are most consistent with the following: Evidence of bilateral median  mononeuropathy at or distal to the wrist, consistent with carpal tunnel syndrome. Findings are mild to moderate in degree electrically bilaterally. Ultrasound confirms increased wrist to forearm ratios bilaterally. There are no compressive lesions or structural abnormalities seen on ultrasound. The residuals of an old intraspinal canal lesion (ie: motor radiculopathy) at the left C7 root or segment, mild in degree electrically. No electrodiagnostic evidence of a right cervical (C5-C8) motor radiculopathy. Screening studies for right or left ulnar or radial mononeuropathies are normal.   ___________________________ Jacquelyne Balint, MD    NCS+ Motor Nerve Results    Latency Amplitude F-Lat Segment Distance CV Comment  Site (ms) Norm (mV) Norm (ms)  (cm) (m/s) Norm   Left Median (APB) Motor  Wrist 3.3  < 4.0 7.4  > 6.0        Elbow 8.8 - 7.0 -  Elbow-Wrist 28.5 52  > 50   Right Median (APB) Motor  Wrist 2.7  < 4.0 9.7  > 6.0        Elbow 8.1 - 9.3 -  Elbow-Wrist 28 52  > 50   Left Ulnar (ADM) Motor  Wrist 1.75  < 3.1 11.8  > 7.0        Bel elbow 5.4 - 11.1 -  Bel elbow-Wrist 22 59  > 50   Ab elbow 7.3 - 10.8 -  Ab elbow-Bel elbow 10 53 -   Right Ulnar (ADM) Motor  Wrist 2.2  < 3.1 11.4  > 7.0        Bel elbow 5.6 - 10.7 -  Bel elbow-Wrist 21 62  > 50   Ab elbow 7.3 - 10.5 -  Ab elbow-Bel elbow 10 59 -    Sensory Sites  Neg Peak Lat Amplitude (O-P) Segment Distance Velocity Comment  Site (ms) Norm (V) Norm  (cm) (ms)   Left Median Sensory  Wrist-Dig II *4.1  < 3.6 *12  > 15 Wrist-Dig II 13    Right Median Sensory  Wrist-Dig II *3.7  < 3.6 *11  > 15 Wrist-Dig II 13    Left Radial Sensory  Forearm-Wrist 2.2  < 2.7 22  > 14 Forearm-Wrist 10    Right Radial Sensory  Forearm-Wrist 2.6  < 2.7 18  > 14 Forearm-Wrist 10    Left Ulnar Sensory  Wrist-Dig V 2.9  < 3.1 15  > 10 Wrist-Dig V 11    Right Ulnar Sensory  Wrist-Dig V 2.8  < 3.1 23  > 10 Wrist-Dig V 11     EMG+   Side  Muscle Ins.Act Fibs Fasc Recrt Amp Dur Poly Activation Comment  Right FDI Nml Nml Nml Nml Nml Nml Nml Nml N/A  Right EIP Nml Nml Nml Nml Nml Nml Nml Nml N/A  Right Pronator teres Nml Nml Nml Nml Nml Nml Nml Nml N/A  Right Biceps Nml Nml Nml Nml Nml Nml Nml Nml N/A  Right Triceps Nml Nml Nml Nml Nml Nml Nml Nml N/A  Right Deltoid Nml Nml Nml Nml Nml Nml Nml Nml N/A  Left FDI Nml Nml Nml Nml Nml Nml Nml Nml N/A  Left EIP Nml Nml Nml Nml Nml Nml Nml Nml N/A  Left Pronator teres Nml Nml Nml *1- *1+ *1+ Nml Nml N/A  Left Biceps Nml Nml Nml Nml Nml Nml Nml Nml N/A  Left Triceps Nml Nml Nml *1- *1+ *1+ Nml Nml N/A  Left Deltoid Nml Nml Nml Nml Nml Nml Nml Nml N/A  Left C7 PSP Nml Nml Nml Nml Nml Nml Nml Nml N/A   Nerve Measurements   Site Area Segment Area Ratio Mobility Vascularity Comment   mm Norm   Norm     Left Median  Palm 8.0         Wrist 10.2  < 13.0         Forearm 4.1  < 10.7  Wrist - Forearm *2.49  < 1.50      Right Median  Palm 9.8         Wrist 11.2  < 13.0         Forearm 4.3  < 10.7  Wrist - Forearm *2.60  < 1.50          Waveforms:  Motor           Sensory

## 2023-09-05 ENCOUNTER — Ambulatory Visit: Payer: BC Managed Care – PPO | Admitting: Urology

## 2023-09-05 ENCOUNTER — Ambulatory Visit: Payer: BLUE CROSS/BLUE SHIELD | Admitting: Neurosurgery

## 2023-09-05 VITALS — BP 131/85 | HR 60 | Ht 69.0 in | Wt 199.5 lb

## 2023-09-05 DIAGNOSIS — N410 Acute prostatitis: Secondary | ICD-10-CM | POA: Diagnosis not present

## 2023-09-05 DIAGNOSIS — R350 Frequency of micturition: Secondary | ICD-10-CM | POA: Diagnosis not present

## 2023-09-05 DIAGNOSIS — R972 Elevated prostate specific antigen [PSA]: Secondary | ICD-10-CM

## 2023-09-05 DIAGNOSIS — N5312 Painful ejaculation: Secondary | ICD-10-CM

## 2023-09-05 LAB — URINALYSIS, COMPLETE
Bilirubin, UA: NEGATIVE
Glucose, UA: NEGATIVE
Ketones, UA: NEGATIVE
Nitrite, UA: NEGATIVE
Protein,UA: NEGATIVE
RBC, UA: NEGATIVE
Specific Gravity, UA: 1.02 (ref 1.005–1.030)
Urobilinogen, Ur: 0.2 mg/dL (ref 0.2–1.0)
pH, UA: 5.5 (ref 5.0–7.5)

## 2023-09-05 LAB — MICROSCOPIC EXAMINATION

## 2023-09-05 LAB — BLADDER SCAN AMB NON-IMAGING: Scan Result: 0

## 2023-09-05 NOTE — Progress Notes (Signed)
Marcelle Overlie Plume,acting as a scribe for Vanna Scotland, MD.,have documented all relevant documentation on the behalf of Vanna Scotland, MD,as directed by  Vanna Scotland, MD while in the presence of Vanna Scotland, MD.  09/05/2023 10:14 AM   Darrol Jump [birth year] [id number]  Referring provider: Mick Sell, MD [address] [place],  [place] [address]  Chief Complaint  Patient presents with   ER follow up    HPI: 57 year-old male who presents today to establish care for an episode of acute prostatitis.   He was seen in the emergency room on 08/14/2023 complaining of low back pain for a week along with dysuria, urgency, and frequency. He had actually seen his primary care the previous day for these issues and was started on Cipro. He actually went to the emergency room because of these same symptoms but also right > left arm tingling. He had a CT abdomen pelvis in the emergency room that showed prominent seminal vesicles with surrounding inflammation worrisome for an infectious process.   His urinalysis at the time was frankly positive, including many RBC, WBC and nitrate positive. A urine culture grew E.coli mostly pan-sensitive, resistant only to ampicillin.  He had a follow up urinalysis on 08/30/2023 that showed no growth and only had 9 WBC, otherwise unremarkable. He will have completed a 4 week long course of Cipro at this point. It was extended by Dr. Sampson Goon when he was seen in the office on 08/20/2023.   He also had a PSA checked shortly thereafter and was elevated to 11.5 by his PCP. His PSA prior to that in 2022 was 0.53.   His urinalysis today shows 11-30 WBC.  Currently, he reports ongoing urinary urgency and frequency, occasional pain and burning with urination, and tingling in the right arm. He denies any baseline urinary symptoms or bowel issues. There is also pain with ejaculation.  Results for orders placed or performed in visit on 09/05/23  Bladder  Scan (Post Void Residual) in office  Result Value Ref Range   Scan Result 0 ml      PMH: Past Medical History:  Diagnosis Date   Asthma    GERD (gastroesophageal reflux disease)    Hypertension     Surgical History: Past Surgical History:  Procedure Laterality Date   CATARACT EXTRACTION Right    HERNIA REPAIR     SKIN CANCER EXCISION     nose    Home Medications:  Allergies as of 09/05/2023   No Known Allergies      Medication List        Accurate as of September 05, 2023 10:14 AM. If you have any questions, ask your nurse or doctor.          STOP taking these medications    fluticasone-salmeterol 250-50 MCG/ACT Aepb Commonly known as: ADVAIR       TAKE these medications    albuterol 108 (90 Base) MCG/ACT inhaler Commonly known as: VENTOLIN HFA Inhale 2 puffs into the lungs every 6 (six) hours as needed.   ciprofloxacin 500 MG tablet Commonly known as: CIPRO Take 500 mg by mouth 2 (two) times daily.   ibuprofen 800 MG tablet Commonly known as: ADVIL Take 1 tablet (800 mg total) by mouth every 8 (eight) hours as needed for moderate pain.   lisinopril 40 MG tablet Commonly known as: ZESTRIL Take 40 mg by mouth daily.   montelukast 10 MG tablet Commonly known as: SINGULAIR Take 1 tablet by  mouth at bedtime.   omeprazole 40 MG capsule Commonly known as: PRILOSEC Take 40 mg by mouth daily.        Allergies: No Known Allergies  Family History: No family history on file.  Social History:  reports that he has never smoked. He has never used smokeless tobacco. He reports current alcohol use. No history on file for drug use.   Physical Exam: BP 131/85   Pulse 60   Ht 5\' 9"  (1.753 m)   Wt 199 lb 8 oz (90.5 kg)   BMI 29.46 kg/m   Constitutional:  Alert and oriented, No acute distress. HEENT: [place] AT, moist mucus membranes.  Trachea midline, no masses. Neurologic: Grossly intact, no focal deficits, moving all 4 extremities. Psychiatric:  Normal mood and affect.  IMPRESSION: 1. Prominent seminal vesicles with mild surrounding inflammation worrisome for infection. 2. Mild prominence of the pancreatic duct in the body of the pancreas. No focal lesions are seen. Recommend further evaluation with MRI/MRCP. 3. Sigmoid colon diverticulosis without evidence for diverticulitis. 4. Small fat containing bilateral inguinal hernias. 5. Aortic atherosclerosis.   Aortic Atherosclerosis (ICD10-I70.0).     Electronically Signed   By: Darliss Cheney M.D.   On: 08/14/2023 20:45   CT scan personally reviewed, agree with radiologic interpretation  Assessment & Plan:    1. Infectious prostatitis/ acute prostatitis - Agree with completing full 4 week course of Cipro - Also would promote NSAIDs for ongoing irritative symptoms. He does have some leukocytes in his urine today, but a recent culture was negative, and he is on the appropriate culture sensitive antibiotics. I think he has ongoing inflammatory changes. I would recommend a course of NSAIDs. We discussed Motrin 600 milligrams TID as long as he tolerates this from a GI perceptive. If he does not, we could prescribe meloxicam.  - Monitor symptoms; if persistent post-antibiotic course, consider repeat imaging (CT or prostate MRI) to rule out abscess or other etiologies - He has no baseline urinary symptoms and is emptying is bladder adequately.  - He has no previous history of infectious process and as such, will not pursue further workup but if he has another incident, we will need additional anatomic evaluation for underlying pathology or obstruction.   2. Elevated PSA - Elevated PSA likely secondary to prostatitis; defer further evaluation until inflammation resolves. - Plan to repeat PSA in six months to assess for normalization.  3. Painful ejaculation - Related to inflammation of the prostatic ducts  - Also advised that quality of ejaculate fluid may be different, but should  resolve once the prostatitis is cleared    Return in about 6 months (around 03/04/2024) for repeat PSA lab only or sooner if symptoms recur or fail to resolve.  I have reviewed the above documentation for accuracy and completeness, and I agree with the above.   Vanna Scotland, MD    [facility] [address], [address] [place], [place] [address] [phone]

## 2023-09-07 NOTE — H&P (View-Only) (Signed)
Referring Physician:  Drake Leach, PA-C [address] [place],  [place] [address]  Primary Physician:  Mick Sell, MD  History of Present Illness: 09/12/2023 [name] is here today with a chief complaint of bilateral hand pain.  Appears to have carpal tunnel syndrome.  He has had a previous workup for possible cervical issues however was found to have numbness tingling in his hands underwent a EMG which demonstrated carpal tunnel syndrome bilaterally.  He has tried evening wrist braces.  Has not given him a significant improvement.  He has not tried injections.  He does have some weakness in his bilateral hands.  It worsens at night it wakes him from sleeping.  He often has to shake out his hands.  Review of Systems:  A 10 point review of systems is negative, except for the pertinent positives and negatives detailed in the HPI.  Past Medical History: Past Medical History:  Diagnosis Date   Asthma    Bilateral carpal tunnel syndrome    COVID-19 2020   GERD (gastroesophageal reflux disease)    Hx of colonoscopy    Hypertension    Inguinal hernia    Pneumonia    Skin cancer, basal cell    Nose    Past Surgical History: Past Surgical History:  Procedure Laterality Date   CATARACT EXTRACTION Right    COLONOSCOPY     HERNIA REPAIR     SKIN CANCER EXCISION     nose    Allergies: Allergies as of 09/12/2023   (No Known Allergies)    Medications:  Current Outpatient Medications:    albuterol (VENTOLIN HFA) 108 (90 Base) MCG/ACT inhaler, Inhale 2 puffs into the lungs every 6 (six) hours as needed for shortness of breath or wheezing., Disp: , Rfl:    ibuprofen (ADVIL) 800 MG tablet, Take 1 tablet (800 mg total) by mouth every 8 (eight) hours as needed for moderate pain. (Patient not taking: Reported on 09/13/2023), Disp: 15 tablet, Rfl: 0   lisinopril (ZESTRIL) 40 MG tablet, Take 40 mg by mouth daily., Disp: , Rfl:    montelukast  (SINGULAIR) 10 MG tablet, Take 10 mg by mouth every evening., Disp: , Rfl:    omeprazole (PRILOSEC) 40 MG capsule, Take 40 mg by mouth daily., Disp: , Rfl:    ibuprofen (ADVIL) 200 MG tablet, Take 400 mg by mouth every 6 (six) hours as needed for moderate pain (pain score 4-6)., Disp: , Rfl:   Social History: Social History   Tobacco Use   Smoking status: Never   Smokeless tobacco: Never  Vaping Use   Vaping status: Never Used  Substance Use Topics   Alcohol use: Yes    Comment: rarely   Drug use: Never    Family Medical History: History reviewed. No pertinent family history.  Physical Examination: Vitals:   09/12/23 1305  BP: 136/88    General: Patient is in no apparent distress. Attention to examination is appropriate.  Neck:   Supple.  Full range of motion.  Respiratory: Patient is breathing without any difficulty.   NEUROLOGICAL:     Awake, alert, oriented to person, place, and time.  Speech is clear and fluent.   Cranial Nerves: Pupils equal round and reactive to light.  Facial tone is symmetric. Shoulder shrug is symmetric. Tongue protrusion is midline.  There is no pronator drift.  Motor Exam:  He does show slight weakness in his median intrinsic musculature bilaterally.  Phalen's and  reverse Phalen's are positive.  Tinel is negative.  Carpal compression test is positive on the left, symptoms are persistent on the right and not changed.  Hoffman's is absent. Clonus is Absent  Decreased in the median distribution bilaterally.  Gait is normal.     Medical Decision Making  Electrodiagnostics:  [facility]  [address] [place], [address]  [place], [place] [address] Tel: [phone] Fax: [phone] Test Date:  08/28/2023   Patient: Dennis Knapp DOB: [birth year] Physician: Jacquelyne Balint, MD  Sex: Male Height: 5\' 9"  Ref Phys: Lonn Georgia, PA-C  ID#: [id number]     Technician:      History: This is a 57 year old male with numbness, tingling,  and weakness of hands.   NCV & EMG Findings: Extensive electrodiagnostic evaluation of bilateral upper limbs shows: Bilateral median sensory responses show prolonged distal peak latency (L4.1, R3.7 ms) and reduced amplitude (L12, R11 V). Bilateral ulnar and radial sensory responses are within normal limits. Bilateral median (APB) and ulnar (ADM) motor responses are within normal limits. Chronic motor axon loss changes without accompanying active denervation changes are seen in the left pronator teres and triceps muscles.   Evaluation of the left median sensory and the right median sensory nerves showed prolonged distal peak latency (L4.1, R3.7 ms) and reduced amplitude (L12, R11 V).  All remaining nerves (as indicated in the following tables) were within normal limits.  All left vs. right side differences were within normal limits.     The muscle scoring table definition stored in the current test does not match the sentence generator setup.  The sentence could not be generated.   Neuromuscular ultrasound findings: High frequency (4.0-16.0 MHz) B-mode, nonvascular ultrasound of the bilateral upper limbs shows: Cross sectional areas (CSA) of bilateral median (palm to forearm) nerves are within normal limits. Wrist to forearm ratios of bilateral median nerves are increased bilaterally (L2.49, R2.60). No other obvious lesion involving the adjacent bone or tendon is identified. No definite vascular abnormalities.   Impression: This is an abnormal study. The findings are most consistent with the following: Evidence of bilateral median mononeuropathy at or distal to the wrist, consistent with carpal tunnel syndrome. Findings are mild to moderate in degree electrically bilaterally. Ultrasound confirms increased wrist to forearm ratios bilaterally. There are no compressive lesions or structural abnormalities seen on ultrasound. The residuals of an old intraspinal canal lesion (ie: motor radiculopathy)  at the left C7 root or segment, mild in degree electrically. No electrodiagnostic evidence of a right cervical (C5-C8) motor radiculopathy. Screening studies for right or left ulnar or radial mononeuropathies are normal.     ___________________________ Jacquelyne Balint, MD     NCS+ Motor Nerve Results                 Latency Amplitude F-Lat Segment Distance CV Comment  Site (ms) Norm (mV) Norm (ms)   (cm) (m/s) Norm    Left Median (APB) Motor  Wrist 3.3  < 4.0 7.4  > 6.0              Elbow 8.8 - 7.0 -   Elbow-Wrist 28.5 52  > 50    Right Median (APB) Motor  Wrist 2.7  < 4.0 9.7  > 6.0              Elbow 8.1 - 9.3 -   Elbow-Wrist 28 52  > 50    Left Ulnar (ADM) Motor  Wrist 1.75  < 3.1  11.8  > 7.0              Bel elbow 5.4 - 11.1 -   Bel elbow-Wrist 22 59  > 50    Ab elbow 7.3 - 10.8 -   Ab elbow-Bel elbow 10 53 -    Right Ulnar (ADM) Motor  Wrist 2.2  < 3.1 11.4  > 7.0              Bel elbow 5.6 - 10.7 -   Bel elbow-Wrist 21 62  > 50    Ab elbow 7.3 - 10.5 -   Ab elbow-Bel elbow 10 59 -      Sensory Sites               Neg Peak Lat Amplitude (O-P) Segment Distance Velocity Comment  Site (ms) Norm (V) Norm   (cm) (ms)    Left Median Sensory  Wrist-Dig II *4.1  < 3.6 *12  > 15 Wrist-Dig II 13      Right Median Sensory  Wrist-Dig II *3.7  < 3.6 *11  > 15 Wrist-Dig II 13      Left Radial Sensory  Forearm-Wrist 2.2  < 2.7 22  > 14 Forearm-Wrist 10      Right Radial Sensory  Forearm-Wrist 2.6  < 2.7 18  > 14 Forearm-Wrist 10      Left Ulnar Sensory  Wrist-Dig V 2.9  < 3.1 15  > 10 Wrist-Dig V 11      Right Ulnar Sensory  Wrist-Dig V 2.8  < 3.1 23  > 10 Wrist-Dig V 11        EMG+    Side Muscle Ins.Act Fibs Fasc Recrt Amp Dur Poly Activation Comment  Right FDI Nml Nml Nml Nml Nml Nml Nml Nml N/A  Right EIP Nml Nml Nml Nml Nml Nml Nml Nml N/A  Right Pronator teres Nml Nml Nml Nml Nml Nml Nml Nml N/A  Right Biceps Nml Nml Nml Nml Nml Nml Nml Nml N/A  Right Triceps Nml Nml  Nml Nml Nml Nml Nml Nml N/A  Right Deltoid Nml Nml Nml Nml Nml Nml Nml Nml N/A  Left FDI Nml Nml Nml Nml Nml Nml Nml Nml N/A  Left EIP Nml Nml Nml Nml Nml Nml Nml Nml N/A  Left Pronator teres Nml Nml Nml *1- *1+ *1+ Nml Nml N/A  Left Biceps Nml Nml Nml Nml Nml Nml Nml Nml N/A  Left Triceps Nml Nml Nml *1- *1+ *1+ Nml Nml N/A  Left Deltoid Nml Nml Nml Nml Nml Nml Nml Nml N/A  Left C7 PSP Nml Nml Nml Nml Nml Nml Nml Nml N/A    Nerve Measurements              Site Area Segment Area Ratio Mobility Vascularity Comment    mm Norm     Norm        Left Median  Palm 8.0                Wrist 10.2  < 13.0               Forearm 4.1  < 10.7  Wrist - Forearm *2.49  < 1.50         Right Median  Palm 9.8                Wrist 11.2  < 13.0  Forearm 4.3  < 10.7  Wrist - Forearm *2.60  < 1.50                 I have personally reviewed the images and electrodiagnostics and agree with the above interpretation.  Assessment and Plan: Dennis Knapp is a pleasant 57 y.o. male with recent diagnosis of bilateral carpal tunnel syndrome.  This often wakes him from sleep.  This is impacting his quality life.  He has noticed grip weakness bilaterally.  Loss of sensation this persistent on the right and intermittent on the left.  He often has to wake up in the middle the night shaking his hands.  EMG verifies swelling of the median nerve at the forearm/wrist, and electrodiagnostic findings of decreased nerve conduction velocities at the wrist as well.  This consistent with carpal tunnel syndrome.  He has tried bracing without significant improvement.  Given his worsening symptoms we will plan for a right sided carpal tunnel decompression with ultrasound guidance.  Thank you for involving me in the care of this patient.    Lovenia Kim MD/MSCR Neurosurgery - Peripheral Nerve Surgery

## 2023-09-07 NOTE — Progress Notes (Signed)
Referring Physician:  Drake Leach, PA-C [address] [place],  [place] [address]  Primary Physician:  Mick Sell, MD  History of Present Illness: 09/12/2023 [name] is here today with a chief complaint of bilateral hand pain.  Appears to have carpal tunnel syndrome.  He has had a previous workup for possible cervical issues however was found to have numbness tingling in his hands underwent a EMG which demonstrated carpal tunnel syndrome bilaterally.  He has tried evening wrist braces.  Has not given him a significant improvement.  He has not tried injections.  He does have some weakness in his bilateral hands.  It worsens at night it wakes him from sleeping.  He often has to shake out his hands.  Review of Systems:  A 10 point review of systems is negative, except for the pertinent positives and negatives detailed in the HPI.  Past Medical History: Past Medical History:  Diagnosis Date   Asthma    Bilateral carpal tunnel syndrome    COVID-19 2020   GERD (gastroesophageal reflux disease)    Hx of colonoscopy    Hypertension    Inguinal hernia    Pneumonia    Skin cancer, basal cell    Nose    Past Surgical History: Past Surgical History:  Procedure Laterality Date   CATARACT EXTRACTION Right    COLONOSCOPY     HERNIA REPAIR     SKIN CANCER EXCISION     nose    Allergies: Allergies as of 09/12/2023   (No Known Allergies)    Medications:  Current Outpatient Medications:    albuterol (VENTOLIN HFA) 108 (90 Base) MCG/ACT inhaler, Inhale 2 puffs into the lungs every 6 (six) hours as needed for shortness of breath or wheezing., Disp: , Rfl:    ibuprofen (ADVIL) 800 MG tablet, Take 1 tablet (800 mg total) by mouth every 8 (eight) hours as needed for moderate pain. (Patient not taking: Reported on 09/13/2023), Disp: 15 tablet, Rfl: 0   lisinopril (ZESTRIL) 40 MG tablet, Take 40 mg by mouth daily., Disp: , Rfl:    montelukast  (SINGULAIR) 10 MG tablet, Take 10 mg by mouth every evening., Disp: , Rfl:    omeprazole (PRILOSEC) 40 MG capsule, Take 40 mg by mouth daily., Disp: , Rfl:    ibuprofen (ADVIL) 200 MG tablet, Take 400 mg by mouth every 6 (six) hours as needed for moderate pain (pain score 4-6)., Disp: , Rfl:   Social History: Social History   Tobacco Use   Smoking status: Never   Smokeless tobacco: Never  Vaping Use   Vaping status: Never Used  Substance Use Topics   Alcohol use: Yes    Comment: rarely   Drug use: Never    Family Medical History: History reviewed. No pertinent family history.  Physical Examination: Vitals:   09/12/23 1305  BP: 136/88    General: Patient is in no apparent distress. Attention to examination is appropriate.  Neck:   Supple.  Full range of motion.  Respiratory: Patient is breathing without any difficulty.   NEUROLOGICAL:     Awake, alert, oriented to person, place, and time.  Speech is clear and fluent.   Cranial Nerves: Pupils equal round and reactive to light.  Facial tone is symmetric. Shoulder shrug is symmetric. Tongue protrusion is midline.  There is no pronator drift.  Motor Exam:  He does show slight weakness in his median intrinsic musculature bilaterally.  Phalen's and  reverse Phalen's are positive.  Tinel is negative.  Carpal compression test is positive on the left, symptoms are persistent on the right and not changed.  Hoffman's is absent. Clonus is Absent  Decreased in the median distribution bilaterally.  Gait is normal.     Medical Decision Making  Electrodiagnostics:  [facility]  [address] [place], [address]  [place], [place] [address] Tel: [phone] Fax: [phone] Test Date:  08/28/2023   Patient: Dennis Knapp DOB: [birth year] Physician: Jacquelyne Balint, MD  Sex: Male Height: 5\' 9"  Ref Phys: Dennis Georgia, PA-C  ID#: [id number]     Technician:      History: This is a 57 year old male with numbness, tingling,  and weakness of hands.   NCV & EMG Findings: Extensive electrodiagnostic evaluation of bilateral upper limbs shows: Bilateral median sensory responses show prolonged distal peak latency (L4.1, R3.7 ms) and reduced amplitude (L12, R11 V). Bilateral ulnar and radial sensory responses are within normal limits. Bilateral median (APB) and ulnar (ADM) motor responses are within normal limits. Chronic motor axon loss changes without accompanying active denervation changes are seen in the left pronator teres and triceps muscles.   Evaluation of the left median sensory and the right median sensory nerves showed prolonged distal peak latency (L4.1, R3.7 ms) and reduced amplitude (L12, R11 V).  All remaining nerves (as indicated in the following tables) were within normal limits.  All left vs. right side differences were within normal limits.     The muscle scoring table definition stored in the current test does not match the sentence generator setup.  The sentence could not be generated.   Neuromuscular ultrasound findings: High frequency (4.0-16.0 MHz) B-mode, nonvascular ultrasound of the bilateral upper limbs shows: Cross sectional areas (CSA) of bilateral median (palm to forearm) nerves are within normal limits. Wrist to forearm ratios of bilateral median nerves are increased bilaterally (L2.49, R2.60). No other obvious lesion involving the adjacent bone or tendon is identified. No definite vascular abnormalities.   Impression: This is an abnormal study. The findings are most consistent with the following: Evidence of bilateral median mononeuropathy at or distal to the wrist, consistent with carpal tunnel syndrome. Findings are mild to moderate in degree electrically bilaterally. Ultrasound confirms increased wrist to forearm ratios bilaterally. There are no compressive lesions or structural abnormalities seen on ultrasound. The residuals of an old intraspinal canal lesion (ie: motor radiculopathy)  at the left C7 root or segment, mild in degree electrically. No electrodiagnostic evidence of a right cervical (C5-C8) motor radiculopathy. Screening studies for right or left ulnar or radial mononeuropathies are normal.     ___________________________ Jacquelyne Balint, MD     NCS+ Motor Nerve Results                 Latency Amplitude F-Lat Segment Distance CV Comment  Site (ms) Norm (mV) Norm (ms)   (cm) (m/s) Norm    Left Median (APB) Motor  Wrist 3.3  < 4.0 7.4  > 6.0              Elbow 8.8 - 7.0 -   Elbow-Wrist 28.5 52  > 50    Right Median (APB) Motor  Wrist 2.7  < 4.0 9.7  > 6.0              Elbow 8.1 - 9.3 -   Elbow-Wrist 28 52  > 50    Left Ulnar (ADM) Motor  Wrist 1.75  < 3.1  11.8  > 7.0              Bel elbow 5.4 - 11.1 -   Bel elbow-Wrist 22 59  > 50    Ab elbow 7.3 - 10.8 -   Ab elbow-Bel elbow 10 53 -    Right Ulnar (ADM) Motor  Wrist 2.2  < 3.1 11.4  > 7.0              Bel elbow 5.6 - 10.7 -   Bel elbow-Wrist 21 62  > 50    Ab elbow 7.3 - 10.5 -   Ab elbow-Bel elbow 10 59 -      Sensory Sites               Neg Peak Lat Amplitude (O-P) Segment Distance Velocity Comment  Site (ms) Norm (V) Norm   (cm) (ms)    Left Median Sensory  Wrist-Dig II *4.1  < 3.6 *12  > 15 Wrist-Dig II 13      Right Median Sensory  Wrist-Dig II *3.7  < 3.6 *11  > 15 Wrist-Dig II 13      Left Radial Sensory  Forearm-Wrist 2.2  < 2.7 22  > 14 Forearm-Wrist 10      Right Radial Sensory  Forearm-Wrist 2.6  < 2.7 18  > 14 Forearm-Wrist 10      Left Ulnar Sensory  Wrist-Dig V 2.9  < 3.1 15  > 10 Wrist-Dig V 11      Right Ulnar Sensory  Wrist-Dig V 2.8  < 3.1 23  > 10 Wrist-Dig V 11        EMG+    Side Muscle Ins.Act Fibs Fasc Recrt Amp Dur Poly Activation Comment  Right FDI Nml Nml Nml Nml Nml Nml Nml Nml N/A  Right EIP Nml Nml Nml Nml Nml Nml Nml Nml N/A  Right Pronator teres Nml Nml Nml Nml Nml Nml Nml Nml N/A  Right Biceps Nml Nml Nml Nml Nml Nml Nml Nml N/A  Right Triceps Nml Nml  Nml Nml Nml Nml Nml Nml N/A  Right Deltoid Nml Nml Nml Nml Nml Nml Nml Nml N/A  Left FDI Nml Nml Nml Nml Nml Nml Nml Nml N/A  Left EIP Nml Nml Nml Nml Nml Nml Nml Nml N/A  Left Pronator teres Nml Nml Nml *1- *1+ *1+ Nml Nml N/A  Left Biceps Nml Nml Nml Nml Nml Nml Nml Nml N/A  Left Triceps Nml Nml Nml *1- *1+ *1+ Nml Nml N/A  Left Deltoid Nml Nml Nml Nml Nml Nml Nml Nml N/A  Left C7 PSP Nml Nml Nml Nml Nml Nml Nml Nml N/A    Nerve Measurements              Site Area Segment Area Ratio Mobility Vascularity Comment    mm Norm     Norm        Left Median  Palm 8.0                Wrist 10.2  < 13.0               Forearm 4.1  < 10.7  Wrist - Forearm *2.49  < 1.50         Right Median  Palm 9.8                Wrist 11.2  < 13.0  Forearm 4.3  < 10.7  Wrist - Forearm *2.60  < 1.50                 I have personally reviewed the images and electrodiagnostics and agree with the above interpretation.  Assessment and Plan: Mr. Alghamdi is a pleasant 57 y.o. male with recent diagnosis of bilateral carpal tunnel syndrome.  This often wakes him from sleep.  This is impacting his quality life.  He has noticed grip weakness bilaterally.  Loss of sensation this persistent on the right and intermittent on the left.  He often has to wake up in the middle the night shaking his hands.  EMG verifies swelling of the median nerve at the forearm/wrist, and electrodiagnostic findings of decreased nerve conduction velocities at the wrist as well.  This consistent with carpal tunnel syndrome.  He has tried bracing without significant improvement.  Given his worsening symptoms we will plan for a right sided carpal tunnel decompression with ultrasound guidance.  Thank you for involving me in the care of this patient.    Lovenia Kim MD/MSCR Neurosurgery - Peripheral Nerve Surgery

## 2023-09-12 ENCOUNTER — Encounter: Payer: Self-pay | Admitting: Neurosurgery

## 2023-09-12 ENCOUNTER — Telehealth: Payer: Self-pay | Admitting: Neurosurgery

## 2023-09-12 ENCOUNTER — Other Ambulatory Visit: Payer: Self-pay

## 2023-09-12 ENCOUNTER — Ambulatory Visit: Payer: BC Managed Care – PPO | Admitting: Neurosurgery

## 2023-09-12 VITALS — BP 136/88 | Ht 69.0 in | Wt 199.0 lb

## 2023-09-12 DIAGNOSIS — Z01818 Encounter for other preprocedural examination: Secondary | ICD-10-CM

## 2023-09-12 DIAGNOSIS — G5603 Carpal tunnel syndrome, bilateral upper limbs: Secondary | ICD-10-CM | POA: Diagnosis not present

## 2023-09-12 NOTE — Telephone Encounter (Signed)
Patient's wife, Paula Compton is calling back to let our office know that he would like to have surgery on 09/25/2023 if possible.

## 2023-09-12 NOTE — Telephone Encounter (Signed)
I spoke with Dennis Knapp and confirmed that surgery has been scheduled for 09/25/23. Per her request, I will also send a mychart message with the CPT code so they can contact insurance to inquire about their potential cost/responsibility.

## 2023-09-12 NOTE — Telephone Encounter (Signed)
Patient's wife, Paula Compton is calling to let our office know that the patient would like to have surgery on 10/25/2023. She states that if you have any questions, please give her a call at work at [phone].

## 2023-09-12 NOTE — Patient Instructions (Signed)
Please see below for information in regards to your upcoming surgery:   Planned surgery: Right carpal tunnel release with ultrasound guidance   Surgery date: (contact office when you are ready to schedule) at [facility]: [address], [place], [place] [address]) - you will find out your arrival time the business day before your surgery.   Pre-op appointment at [facility] Pre-admit Testing: we will call you with a date/time for this. This will be a phone interview. Should you need to change your pre-op appointment, please call Pre-admit testing at [phone].     How to contact us:  If you have any questions/concerns before or after surgery, you can reach [place] at [phone], or you can send a mychart message. We can be reached by phone or mychart 8am-4pm, Monday-Friday.  *Please note: Calls after 4pm are forwarded to a third party answering service. Mychart messages are not routinely monitored during evenings, weekends, and holidays. Please call our office to contact the answering service for urgent concerns during non-business hours.    If you have FMLA/disability paperwork, please drop it off or fax it to [phone], attention Patty.   Appointments/FMLA & disability paperwork: Joycelyn Rua, & Flonnie Hailstone Registered Nurse/Surgery scheduler: Royston Cowper Medical Assistants: Nash Mantis Physician Assistants: Joan Flores, PA-C, Manning Charity, PA-C & Drake Leach, PA-C Surgeons: Venetia Night, MD & Ernestine Mcmurray, MD

## 2023-09-17 ENCOUNTER — Other Ambulatory Visit: Payer: Self-pay

## 2023-09-17 ENCOUNTER — Encounter
Admission: RE | Admit: 2023-09-17 | Discharge: 2023-09-17 | Disposition: A | Discharge status: Home / Self Care | Payer: BC Managed Care – PPO | Source: Ambulatory Visit | Attending: Neurosurgery | Admitting: Neurosurgery

## 2023-09-17 HISTORY — DX: Carpal tunnel syndrome, bilateral upper limbs: G56.03

## 2023-09-17 HISTORY — DX: Pneumonia, unspecified organism: J18.9

## 2023-09-17 NOTE — Patient Instructions (Addendum)
Your procedure is scheduled on: 09/25/23  - Tuesday Report to the Registration Desk on the 1st floor of the [facility]. To find out your arrival time, please call [phone] between 1PM - 3PM on: 09/24/23 - Monday If your arrival time is 6:00 am, do not arrive before that time as the [facility] entrance doors do not open until 6:00 am.  REMEMBER: Instructions that are not followed completely may result in serious medical risk, up to and including death; or upon the discretion of your surgeon and anesthesiologist your surgery may need to be rescheduled.  Do not eat food after midnight the night before surgery.  No gum chewing or hard candies.  You may however, drink CLEAR liquids up to 2 hours before you are scheduled to arrive for your surgery. Do not drink anything within 2 hours of your scheduled arrival time.  Clear liquids include: - water  - apple juice without pulp - gatorade (not RED colors) - black coffee or tea (Do NOT add milk or creamers to the coffee or tea) Do NOT drink anything that is not on this list.   One week prior to surgery: Stop Anti-inflammatories (NSAIDS) such as Advil, Aleve, Ibuprofen, Motrin, Naproxen, Naprosyn and Aspirin based products such as Excedrin, Goody's Powder, BC Powder. You may take Tylenol if needed for pain up until the day of surgery.  Stop ANY OVER THE COUNTER supplements until after surgery.  ON THE DAY OF SURGERY ONLY TAKE THESE MEDICATIONS WITH SIPS OF WATER:  omeprazole (PRILOSEC)  albuterol (VENTOLIN HFA) - bring to the hospital on the day of surgery.  No Alcohol for 24 hours before or after surgery.  No Smoking including e-cigarettes for 24 hours before surgery.  No chewable tobacco products for at least 6 hours before surgery.  No nicotine patches on the day of surgery.  Do not use any "recreational" drugs for at least a week (preferably 2 weeks) before your surgery.  Please be advised that the combination of cocaine and  anesthesia may have negative outcomes, up to and including death. If you test positive for cocaine, your surgery will be cancelled.  On the morning of surgery brush your teeth with toothpaste and water, you may rinse your mouth with mouthwash if you wish. Do not swallow any toothpaste or mouthwash.  Use CHG Soap or wipes as directed on instruction sheet.  Do not wear jewelry, make-up, hairpins, clips or nail polish.  For welded (permanent) jewelry: bracelets, anklets, waist bands, etc.  Please have this removed prior to surgery.  If it is not removed, there is a chance that hospital personnel will need to cut it off on the day of surgery.  Do not wear lotions, powders, or perfumes.   Do not shave body hair from the neck down 48 hours before surgery.  Contact lenses, hearing aids and dentures may not be worn into surgery.  Do not bring valuables to the hospital. [facility] is not responsible for any missing/lost belongings or valuables.   Notify your doctor if there is any change in your medical condition (cold, fever, infection).  Wear comfortable clothing (specific to your surgery type) to the hospital.  After surgery, you can help prevent lung complications by doing breathing exercises.  Take deep breaths and cough every 1-2 hours. Your doctor may order a device called an Incentive Spirometer to help you take deep breaths. When coughing or sneezing, hold a pillow firmly against your incision with both hands. This is  called "splinting." Doing this helps protect your incision. It also decreases belly discomfort.  If you are being admitted to the hospital overnight, leave your suitcase in the car. After surgery it may be brought to your room.  In case of increased patient census, it may be necessary for you, the patient, to continue your postoperative care in the Same Day Surgery department.  If you are being discharged the day of surgery, you will not be allowed to drive home. You  will need a responsible individual to drive you home and stay with you for 24 hours after surgery.   If you are taking public transportation, you will need to have a responsible individual with you.  Please call the Pre-admissions Testing Dept. at [phone] if you have any questions about these instructions.  Surgery Visitation Policy:  Patients having surgery or a procedure may have two visitors.  Children under the age of 46 must have an adult with them who is not the patient.  Inpatient Visitation:    Visiting hours are 7 a.m. to 8 p.m. Up to four visitors are allowed at one time in a patient room. The visitors may rotate out with other people during the day.  One visitor age 61 or older may stay with the patient overnight and must be in the room by 8 p.m.     Preparing for Surgery with CHLORHEXIDINE GLUCONATE (CHG) Soap  Chlorhexidine Gluconate (CHG) Soap  o An antiseptic cleaner that kills germs and bonds with the skin to continue killing germs even after washing  o Used for showering the night before surgery and morning of surgery  Before surgery, you can play an important role by reducing the number of germs on your skin.  CHG (Chlorhexidine gluconate) soap is an antiseptic cleanser which kills germs and bonds with the skin to continue killing germs even after washing.  Please do not use if you have an allergy to CHG or antibacterial soaps. If your skin becomes reddened/irritated stop using the CHG.  1. Shower the NIGHT BEFORE SURGERY and the MORNING OF SURGERY with CHG soap.  2. If you choose to wash your hair, wash your hair first as usual with your normal shampoo.  3. After shampooing, rinse your hair and body thoroughly to remove the shampoo.  4. Use CHG as you would any other liquid soap. You can apply CHG directly to the skin and wash gently with a scrungie or a clean washcloth.  5. Apply the CHG soap to your body only from the neck down. Do not use on open  wounds or open sores. Avoid contact with your eyes, ears, mouth, and genitals (private parts). Wash face and genitals (private parts) with your normal soap.  6. Wash thoroughly, paying special attention to the area where your surgery will be performed.  7. Thoroughly rinse your body with warm water.  8. Do not shower/wash with your normal soap after using and rinsing off the CHG soap.  9. Pat yourself dry with a clean towel.  10. Wear clean pajamas to bed the night before surgery.  12. Place clean sheets on your bed the night of your first shower and do not sleep with pets.  13. Shower again with the CHG soap on the day of surgery prior to arriving at the hospital.  14. Do not apply any deodorants/lotions/powders.  15. Please wear clean clothes to the hospital.

## 2023-09-24 ENCOUNTER — Encounter: Payer: BC Managed Care – PPO | Admitting: Neurology

## 2023-09-25 ENCOUNTER — Ambulatory Visit
Admission: RE | Admit: 2023-09-25 | Discharge: 2023-09-25 | Disposition: A | Discharge status: Home / Self Care | Payer: BC Managed Care – PPO | Attending: Neurosurgery | Admitting: Neurosurgery

## 2023-09-25 ENCOUNTER — Ambulatory Visit: Payer: BC Managed Care – PPO | Admitting: Certified Registered"

## 2023-09-25 ENCOUNTER — Encounter: Payer: Self-pay | Admitting: Neurosurgery

## 2023-09-25 ENCOUNTER — Other Ambulatory Visit: Payer: Self-pay

## 2023-09-25 ENCOUNTER — Encounter
Admission: RE | Disposition: A | Discharge status: Home / Self Care | Payer: Self-pay | Source: Home / Self Care | Attending: Neurosurgery

## 2023-09-25 DIAGNOSIS — Z01818 Encounter for other preprocedural examination: Secondary | ICD-10-CM

## 2023-09-25 DIAGNOSIS — G6289 Other specified polyneuropathies: Secondary | ICD-10-CM | POA: Diagnosis present

## 2023-09-25 DIAGNOSIS — G5601 Carpal tunnel syndrome, right upper limb: Secondary | ICD-10-CM

## 2023-09-25 DIAGNOSIS — I10 Essential (primary) hypertension: Secondary | ICD-10-CM | POA: Insufficient documentation

## 2023-09-25 DIAGNOSIS — G5600 Carpal tunnel syndrome, unspecified upper limb: Secondary | ICD-10-CM | POA: Diagnosis present

## 2023-09-25 DIAGNOSIS — K219 Gastro-esophageal reflux disease without esophagitis: Secondary | ICD-10-CM | POA: Diagnosis not present

## 2023-09-25 DIAGNOSIS — Z85828 Personal history of other malignant neoplasm of skin: Secondary | ICD-10-CM | POA: Diagnosis not present

## 2023-09-25 DIAGNOSIS — R531 Weakness: Secondary | ICD-10-CM | POA: Diagnosis not present

## 2023-09-25 DIAGNOSIS — J45909 Unspecified asthma, uncomplicated: Secondary | ICD-10-CM | POA: Insufficient documentation

## 2023-09-25 HISTORY — PX: CARPAL TUNNEL RELEASE: SHX101

## 2023-09-25 SURGERY — CARPAL TUNNEL RELEASE
Anesthesia: General | Site: Hand | Laterality: Right

## 2023-09-25 MED ORDER — CEFAZOLIN SODIUM-DEXTROSE 2-4 GM/100ML-% IV SOLN
INTRAVENOUS | Status: AC
  Filled 2023-09-25: qty 100

## 2023-09-25 MED ORDER — CHLORHEXIDINE GLUCONATE 0.12 % MT SOLN
OROMUCOSAL | Status: AC
Start: 2023-09-25 — End: ?
  Filled 2023-09-25: qty 15

## 2023-09-25 MED ORDER — OXYCODONE HCL 5 MG/5ML PO SOLN: 5.0000 mg | Freq: Once | ORAL | Status: DC | PRN

## 2023-09-25 MED ORDER — DEXAMETHASONE SODIUM PHOSPHATE 10 MG/ML IJ SOLN
INTRAMUSCULAR | Status: DC | PRN
  Administered 2023-09-25: 10 mg via INTRAVENOUS

## 2023-09-25 MED ORDER — FENTANYL CITRATE (PF) 100 MCG/2ML IJ SOLN
INTRAMUSCULAR | Status: AC
  Filled 2023-09-25: qty 2

## 2023-09-25 MED ORDER — FENTANYL CITRATE (PF) 100 MCG/2ML IJ SOLN
INTRAMUSCULAR | Status: DC | PRN
  Administered 2023-09-25 (×2): 25 ug via INTRAVENOUS
  Administered 2023-09-25: 50 ug via INTRAVENOUS

## 2023-09-25 MED ORDER — PROPOFOL 1000 MG/100ML IV EMUL
INTRAVENOUS | Status: AC
  Filled 2023-09-25: qty 100

## 2023-09-25 MED ORDER — LIDOCAINE HCL 1 % IJ SOLN
INTRAMUSCULAR | Status: DC | PRN
  Administered 2023-09-25: 4 mL

## 2023-09-25 MED ORDER — OXYCODONE HCL 5 MG PO TABS: 5.0000 mg | ORAL_TABLET | Freq: Once | ORAL | Status: DC | PRN

## 2023-09-25 MED ORDER — LIDOCAINE HCL (PF) 1 % IJ SOLN
INTRAMUSCULAR | Status: AC
  Filled 2023-09-25: qty 30

## 2023-09-25 MED ORDER — PROPOFOL 10 MG/ML IV BOLUS
INTRAVENOUS | Status: DC | PRN
  Administered 2023-09-25: 60 mg via INTRAVENOUS

## 2023-09-25 MED ORDER — CHLORHEXIDINE GLUCONATE 0.12 % MT SOLN
15.0000 mL | Freq: Once | OROMUCOSAL | Status: AC
Start: 2023-09-25 — End: 2023-09-25
  Administered 2023-09-25: 15 mL via OROMUCOSAL

## 2023-09-25 MED ORDER — ACETAMINOPHEN 10 MG/ML IV SOLN
INTRAVENOUS | Status: AC
  Filled 2023-09-25: qty 100

## 2023-09-25 MED ORDER — LIDOCAINE HCL (CARDIAC) PF 100 MG/5ML IV SOSY
PREFILLED_SYRINGE | INTRAVENOUS | Status: DC | PRN
  Administered 2023-09-25: 60 mg via INTRATRACHEAL

## 2023-09-25 MED ORDER — ONDANSETRON HCL 4 MG/2ML IJ SOLN
INTRAMUSCULAR | Status: DC | PRN
  Administered 2023-09-25 (×2): 4 mg via INTRAVENOUS

## 2023-09-25 MED ORDER — MIDAZOLAM HCL 2 MG/2ML IJ SOLN
INTRAMUSCULAR | Status: AC
Start: 2023-09-25 — End: ?
  Filled 2023-09-25: qty 2

## 2023-09-25 MED ORDER — 0.9 % SODIUM CHLORIDE (POUR BTL) OPTIME
TOPICAL | Status: DC | PRN
  Administered 2023-09-25: 500 mL

## 2023-09-25 MED ORDER — CEFAZOLIN SODIUM-DEXTROSE 2-4 GM/100ML-% IV SOLN
2.0000 g | Freq: Once | INTRAVENOUS | Status: AC
Start: 2023-09-25 — End: 2023-09-25
  Administered 2023-09-25: 2 g via INTRAVENOUS

## 2023-09-25 MED ORDER — DEXMEDETOMIDINE HCL IN NACL 200 MCG/50ML IV SOLN
INTRAVENOUS | Status: DC | PRN
  Administered 2023-09-25: 12 ug via INTRAVENOUS

## 2023-09-25 MED ORDER — PROPOFOL 500 MG/50ML IV EMUL
INTRAVENOUS | Status: DC | PRN
  Administered 2023-09-25: 165 ug/kg/min via INTRAVENOUS

## 2023-09-25 MED ORDER — ORAL CARE MOUTH RINSE: 15.0000 mL | Freq: Once | OROMUCOSAL | Status: AC

## 2023-09-25 MED ORDER — HYDROCODONE-ACETAMINOPHEN 5-325 MG PO TABS: 1.0000 | ORAL_TABLET | ORAL | 0 refills | Status: DC | PRN

## 2023-09-25 MED ORDER — FENTANYL CITRATE (PF) 100 MCG/2ML IJ SOLN: 25.0000 ug | INTRAMUSCULAR | Status: DC | PRN

## 2023-09-25 MED ORDER — GLYCOPYRROLATE 0.2 MG/ML IJ SOLN
INTRAMUSCULAR | Status: DC | PRN
  Administered 2023-09-25: .2 mg via INTRAVENOUS

## 2023-09-25 MED ORDER — MIDAZOLAM HCL 2 MG/2ML IJ SOLN
INTRAMUSCULAR | Status: DC | PRN
  Administered 2023-09-25: 2 mg via INTRAVENOUS

## 2023-09-25 MED ORDER — ACETAMINOPHEN 10 MG/ML IV SOLN
INTRAVENOUS | Status: DC | PRN
  Administered 2023-09-25: 1000 mg via INTRAVENOUS

## 2023-09-25 MED ORDER — LACTATED RINGERS IV SOLN: INTRAVENOUS | Status: DC

## 2023-09-25 SURGICAL SUPPLY — 17 items
BNDG ADH 1X3 SHEER STRL LF (GAUZE/BANDAGES/DRESSINGS) ×1 IMPLANT
BNDG ADH 2 X3.75 FABRIC TAN LF (GAUZE/BANDAGES/DRESSINGS) ×1 IMPLANT
BRUSH SCRUB EZ 4% CHG (MISCELLANEOUS) ×1 IMPLANT
CHLORAPREP W/TINT 26 (MISCELLANEOUS) ×2 IMPLANT
COVER PROBE FLX POLY STRL (MISCELLANEOUS) ×1 IMPLANT
DERMABOND ADVANCED .7 DNX12 (GAUZE/BANDAGES/DRESSINGS) IMPLANT
DRAPE 3/4 80X56 (DRAPES) ×1 IMPLANT
DRAPE IMP U-DRAPE 54X76 (DRAPES) ×1 IMPLANT
GLOVE SRG 8 PF TXTR STRL LF DI (GLOVE) ×1 IMPLANT
GLOVE SURG SYN 7.5 E (GLOVE) ×1
GLOVE SURG SYN 7.5 PF PI (GLOVE) ×1 IMPLANT
GOWN SRG XL LVL 3 NONREINFORCE (GOWNS) ×1 IMPLANT
KIT TURNOVER KIT A (KITS) ×1 IMPLANT
MANIFOLD NEPTUNE II (INSTRUMENTS) ×1 IMPLANT
NS IRRIG 500ML POUR BTL (IV SOLUTION) ×1 IMPLANT
PACK BASIN MINOR ARMC (MISCELLANEOUS) ×1 IMPLANT
ULTRAGLIDE CTR (BLADE) ×1 IMPLANT

## 2023-09-25 NOTE — Interval H&P Note (Signed)
History and Physical Interval Note:  09/25/2023 7:17 AM  Dennis Knapp  has presented today for surgery, with the diagnosis of G56.01 right carpal tunnel syndrome.  The various methods of treatment have been discussed with the patient and family. After consideration of risks, benefits and other options for treatment, the patient has consented to  Procedure(s): RIGHT CARPAL TUNNEL RELEASE WITH ULTRASOUND GUIDANCE (Right) as a surgical intervention.  The patient's history has been reviewed, patient examined, no change in status, stable for surgery.  I have reviewed the patient's chart and labs.  Questions were answered to the patient's satisfaction.    Heart and lungs clear   Lovenia Kim

## 2023-09-25 NOTE — Op Note (Signed)
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  Findings: Severe compression of the median nerve at the transverse carpal ligament  Preoperative Diagnosis: Hospital Problem List as of 09/25/2023          Priority Resolved POA     Unprioritized     * (Principal) Carpal tunnel syndrome   Yes     Postoperative Diagnosis: same   EBL: Minimal IVF: See anesthesia report Drains: none Disposition:Stable to PACU Complications: none  No foley catheter was placed.   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  They had tried rest, padding, and watchful waiting but had continued progressive symptoms.  Given the progression of her median neuropathy plan was made for median nerve decompression  Risk of surgery is discussed and include: Infection, bleeding, wound healing issues, pillar pain nerve injury, pain, failure to relieve the symptoms, need for further surgery.  Procedure:  1) right sided carpal tunnel decompression with ultrasound guidance   Procedure: After obtaining informed consent, the patient taken to the operating room, placed in supine position, monitored anesthesia care was induced.  They were given preoperative antibiotics.  Prepped and draped in the usual fashion.  Comprehensive timeout was performed verifying the patient's name, MRN, planned procedure.  An ultrasound was used with a sterile probe.  We used this to mark out our safety points including the interval between the ulnar artery and median nerve.  We identified the motor branch as well as the first sensory branch which were both in safe position.  We also identified the vascular arcade which was in safe positioning as well.  At this point we placed a block with lidocaine without epinephrine.  We blocked the skin where the incision would be, the  superficial sensory median nerve, as well as the transverse carpal ligament.  Under ultrasound guidance we utilized the local anesthetic to perform a hydrodissection of the nerve from the transverse carpal ligament.  We then prepped the sonics ultra CTR knife on the back table while the anesthetic set in.  We performed a small linear incision approximately 2 to 3 mm.  We then utilized a Statistician under ultrasound guidance to identify the underside of the transverse carpal ligament.  The fat and connective tissue was dissected off the underside.  We could feel that this was quite thickened and calcified.  Causing severe compression.  Once we had a clear tract we then placed the ultrasound-guided knife into the incision and advanced it through the carpal tunnel.  We verified the safety zones including the median nerve which did not significantly cross over the knife.  We are able to see the first sensory branch which was not crossing the knife.  The artery was also in a safe place.  At this point we divided the transverse carpal ligament under ultrasound guidance, to get a full release it took 2 passes.  The nerve relaxed laterally and was well decompressed.  After the 2 passes we placed a [place]field 4 into the wound and were able to feel a complete dissection of the transverse carpal ligament.  We then irrigated, we got meticulous hemostasis.  Skin glue was placed on the incision and a Band-Aid was placed on top once this was dried.  No immediate complications.  Sponge and pattie counts were correct at the end of the procedure.   I performed the  procedure without an assistant surgeon  Lovenia Kim, MD/MSCR

## 2023-09-25 NOTE — Anesthesia Postprocedure Evaluation (Signed)
Anesthesia Post Note  Patient: Dennis Knapp  Procedure(s) Performed: RIGHT CARPAL TUNNEL RELEASE WITH ULTRASOUND GUIDANCE (Right: Hand)  Patient location during evaluation: PACU Anesthesia Type: General Level of consciousness: awake and alert Pain management: pain level controlled Vital Signs Assessment: post-procedure vital signs reviewed and stable Respiratory status: spontaneous breathing, nonlabored ventilation, respiratory function stable and patient connected to nasal cannula oxygen Cardiovascular status: blood pressure returned to baseline and stable Postop Assessment: no apparent nausea or vomiting Anesthetic complications: no   No notable events documented.   Last Vitals:  Vitals:   09/25/23 0826 09/25/23 0832  BP: (!) 137/90 (!) 140/90  Pulse: 65 64  Resp: 16 16  Temp: 36.5 C 36.7 C  SpO2: 95% 95%    Last Pain:  Vitals:   09/25/23 0832  TempSrc: Temporal  PainSc: 0-No pain                 Louie Boston

## 2023-09-25 NOTE — Anesthesia Procedure Notes (Signed)
Procedure Name: General with mask airway Date/Time: 09/25/2023 7:25 AM  Performed by: Mohammed Kindle, CRNAPre-anesthesia Checklist: Patient identified, Emergency Drugs available, Suction available and Patient being monitored Patient Re-evaluated:Patient Re-evaluated prior to induction Oxygen Delivery Method: Simple face mask Induction Type: IV induction Ventilation: Oral airway inserted - appropriate to patient size Placement Confirmation: positive ETCO2 and breath sounds checked- equal and bilateral Dental Injury: Teeth and Oropharynx as per pre-operative assessment

## 2023-09-25 NOTE — Progress Notes (Signed)
Patient is able to rotate thumb in a circle without difficulty.

## 2023-09-25 NOTE — Anesthesia Preprocedure Evaluation (Addendum)
Anesthesia Evaluation  Patient identified by MRN, date of birth, ID band Patient awake    Reviewed: Allergy & Precautions, NPO status , Patient's Chart, lab work & pertinent test results  History of Anesthesia Complications Negative for: history of anesthetic complications  Airway Mallampati: III  TM Distance: >3 FB Neck ROM: full    Dental  (+) Chipped   Pulmonary asthma    Pulmonary exam normal        Cardiovascular hypertension, Normal cardiovascular exam     Neuro/Psych  Neuromuscular disease  negative psych ROS   GI/Hepatic Neg liver ROS,GERD  Medicated,,  Endo/Other  negative endocrine ROS    Renal/GU negative Renal ROS  negative genitourinary   Musculoskeletal   Abdominal   Peds  Hematology negative hematology ROS (+)   Anesthesia Other Findings Past Medical History: No date: Asthma No date: Bilateral carpal tunnel syndrome 2020: COVID-19 No date: GERD (gastroesophageal reflux disease) No date: Hx of colonoscopy No date: Hypertension No date: Inguinal hernia No date: Pneumonia No date: Skin cancer, basal cell     Comment:  Nose  Past Surgical History: No date: CATARACT EXTRACTION; Right No date: COLONOSCOPY No date: HERNIA REPAIR No date: SKIN CANCER EXCISION     Comment:  nose  BMI    Body Mass Index: 28.80 kg/m      Reproductive/Obstetrics negative OB ROS                             Anesthesia Physical Anesthesia Plan  ASA: 2  Anesthesia Plan: General   Post-op Pain Management: Toradol IV (intra-op)* and Ofirmev IV (intra-op)*   Induction: Intravenous  PONV Risk Score and Plan: 2 and Propofol infusion and TIVA  Airway Management Planned: Natural Airway and Nasal Cannula  Additional Equipment:   Intra-op Plan:   Post-operative Plan:   Informed Consent: I have reviewed the patients History and Physical, chart, labs and discussed the procedure  including the risks, benefits and alternatives for the proposed anesthesia with the patient or authorized representative who has indicated his/her understanding and acceptance.     Dental Advisory Given  Plan Discussed with: Anesthesiologist, CRNA and Surgeon  Anesthesia Plan Comments: (Patient consented for risks of anesthesia including but not limited to:  - adverse reactions to medications - risk of airway placement if required - damage to eyes, teeth, lips or other oral mucosa - nerve damage due to positioning  - sore throat or hoarseness - Damage to heart, brain, nerves, lungs, other parts of body or loss of life  Patient voiced understanding and assent.)       Anesthesia Quick Evaluation

## 2023-09-25 NOTE — Transfer of Care (Signed)
Immediate Anesthesia Transfer of Care Note  Patient: Dennis Knapp  Procedure(s) Performed: RIGHT CARPAL TUNNEL RELEASE WITH ULTRASOUND GUIDANCE (Right: Hand)  Patient Location: PACU  Anesthesia Type:General  Level of Consciousness: drowsy and patient cooperative  Airway & Oxygen Therapy: Patient Spontanous Breathing and Patient connected to face mask oxygen  Post-op Assessment: Report given to RN and Post -op Vital signs reviewed and stable  Post vital signs: Reviewed and stable  Last Vitals:  Vitals Value Taken Time  BP 103/62 09/25/23 0752  Temp    Pulse 51 09/25/23 0755  Resp    SpO2 98 % 09/25/23 0755  Vitals shown include unfiled device data.  Last Pain:  Vitals:   09/25/23 0623  TempSrc: Oral  PainSc: 0-No pain         Complications: No notable events documented.

## 2023-09-25 NOTE — Discharge Instructions (Addendum)
NEUROSURGERY DISCHARGE INSTRUCTIONS  Admission diagnosis: G56.01 right carpal tunnel syndrome  Operative procedure: Right Carpal Tunnel Release with Ultrasound Guidance  What to do after you leave the hospital:  Recommended diet: regular diet. Increase protein intake to promote wound healing.  Recommended activity:  Please avoid heavy use of your right hand for the next 3 days, after 3 days you can slowly increase to your baseline activity. . No driving for if taking postoperative pain medications.You should walk multiple times per day.  Keep your affected limb elevated to help with swelling.  Special Instructions  Keep incision area clean and dry. May shower in 2 days. Please remove bandage on Friday, no need to apply a bandage afterwards  You have no sutures to remove, the skin is closed with adhesive  Please take pain medications as directed. Take a stool softener if on pain medications   Please Report any of the following: Nausea or Vomiting, Temperature is greater than 101.22F (38.1C) degrees, Dizziness, Abdominal Pain, Difficulty Breathing or Shortness of Breath, Inability to Eat, drink Fluids, or Take medications, Bleeding, swelling, or drainage from surgical incision sites, New numbness or weakness, and Bowel or bladder dysfunction to the neurosurgeon on call at [phone]  Additional Follow up appointments: Your appointments will be set in clinic with both myself and by physician associate Joan Flores.   Please see below for scheduled appointments:  Future Appointments  Date Time Provider Department Center  10/08/2023  1:30 PM Joan Flores, PA-C [facility] None  11/07/2023  1:30 PM Lovenia Kim, MD [facility] None  03/04/2024 10:30 AM [facility] None

## 2023-09-26 ENCOUNTER — Encounter: Payer: Self-pay | Admitting: Neurosurgery

## 2023-10-01 ENCOUNTER — Telehealth: Payer: Self-pay | Admitting: Neurosurgery

## 2023-10-01 NOTE — Telephone Encounter (Signed)
right carpal tunnel release w/ ultrasound guidance on 09/25/23  He is going back to work tomorrow. Can he get a letter to give to his HR tomorrow with restrictions please.

## 2023-10-01 NOTE — Telephone Encounter (Signed)
Per discussion with Dr Katrinka Blazing, he may return to work without restrictions. If he feels he isn't ready, he can have restrictions for another week. I spoke with Paula Compton with Mr Dennis Knapp in the background. He feels he is ready to return without restrictions. A work note has been written. He will retreive this via Clinical cytogeneticist.

## 2023-10-08 ENCOUNTER — Encounter: Payer: Self-pay | Admitting: Physician Assistant

## 2023-10-08 ENCOUNTER — Ambulatory Visit (INDEPENDENT_AMBULATORY_CARE_PROVIDER_SITE_OTHER): Payer: BC Managed Care – PPO | Admitting: Physician Assistant

## 2023-10-08 VITALS — BP 124/88 | Ht 69.0 in | Wt 195.0 lb

## 2023-10-08 DIAGNOSIS — G5603 Carpal tunnel syndrome, bilateral upper limbs: Secondary | ICD-10-CM

## 2023-10-08 NOTE — Progress Notes (Signed)
   REFERRING PHYSICIAN:  Mick Sell, Md [address] [place],  [place] [address]  DOS: 09/25/23 Right ultrasound guided carpal tunnel release  HISTORY OF PRESENT ILLNESS: Epimenio Mittag is 2 weeks status post above surgery. Overall, he is doing very well and the numbness and tingling he was experiencing in his right hand has decreased significantly.  He does have some soreness in his hand at this time, but his pain is well-controlled.    PHYSICAL EXAMINATION:  NEUROLOGICAL:  General: In no acute distress.   Awake, alert, oriented to person, place, and time.  Pupils equal round and reactive to light.  Facial tone is symmetric.  Tongue protrusion is midline.  There is no pronator drift.  Strength: Side Biceps Triceps Deltoid Interossei Grip Wrist Ext. Wrist Flex.  R 5 5 5 5 5 5 5   L 5 5 5 5 5 5 5    Incision c/d/I Intrinsic hand muscles to baseline.  Assessment / Plan: Herby Biga is 2 weeks status post above surgery. Overall, he is doing very well and the numbness and tingling he was experiencing in his right hand has decreased significantly.  He does have some soreness in his hand at this time, but his pain is well-controlled.  Plan to see back at 6 weeks post-op.  Advised to contact the office if any questions or concerns arise.   Joan Flores PA-C Dept of Neurosurgery

## 2023-11-07 ENCOUNTER — Encounter: Payer: BC Managed Care – PPO | Admitting: Neurosurgery

## 2023-11-12 ENCOUNTER — Ambulatory Visit (INDEPENDENT_AMBULATORY_CARE_PROVIDER_SITE_OTHER): Payer: BC Managed Care – PPO | Admitting: Physician Assistant

## 2023-11-12 VITALS — BP 132/82 | Ht 69.0 in | Wt 195.0 lb

## 2023-11-12 DIAGNOSIS — Z09 Encounter for follow-up examination after completed treatment for conditions other than malignant neoplasm: Secondary | ICD-10-CM

## 2023-11-12 DIAGNOSIS — G5601 Carpal tunnel syndrome, right upper limb: Secondary | ICD-10-CM

## 2023-11-12 NOTE — Progress Notes (Signed)
.  bbf  REFERRING PHYSICIAN:  Epifanio Alm SQUIBB, Md [address] [place],  [place] [address]  DOS: 09/25/23 Right ultrasound guided carpal tunnel release  HISTORY OF PRESENT ILLNESS: Oak Dorey is 6 weeks status post above surgery. Overall, he is doing very well and the numbness and tingling he was experiencing in his right hand has decreased significantly.    PHYSICAL EXAMINATION:  NEUROLOGICAL:  General: In no acute distress.   Awake, alert, oriented to person, place, and time.  Pupils equal round and reactive to light.  Facial tone is symmetric.  Tongue protrusion is midline.  There is no pronator drift.  Strength: Side Biceps Triceps Deltoid Interossei Grip Wrist Ext. Wrist Flex.  R 5 5 5 5 5 5 5   L 5 5 5 5 5 5 5    Incision healed Intrinsic hand muscles to baseline.  Assessment / Plan: Tyheem Boughner is 6 weeks status post above surgery. Overall, he is doing very well and the numbness and tingling he was experiencing in his right hand has decreased significantly.  Happy to see as needed in the future.  Advised to contact the office if any questions or concerns arise.   Lyle Decamp PA-C Dept of Neurosurgery

## 2024-03-04 ENCOUNTER — Other Ambulatory Visit: Payer: Self-pay

## 2024-03-31 ENCOUNTER — Other Ambulatory Visit

## 2024-04-18 ENCOUNTER — Other Ambulatory Visit

## 2024-04-18 DIAGNOSIS — R972 Elevated prostate specific antigen [PSA]: Secondary | ICD-10-CM

## 2024-04-18 DIAGNOSIS — R350 Frequency of micturition: Secondary | ICD-10-CM

## 2024-04-18 DIAGNOSIS — N410 Acute prostatitis: Secondary | ICD-10-CM

## 2024-04-18 DIAGNOSIS — N5312 Painful ejaculation: Secondary | ICD-10-CM

## 2024-04-19 LAB — PSA: Prostate Specific Ag, Serum: 1.7 ng/mL (ref 0.0–4.0)

## 2024-04-23 ENCOUNTER — Ambulatory Visit: Payer: Self-pay | Admitting: Urology

## 2024-06-16 ENCOUNTER — Other Ambulatory Visit: Payer: Self-pay | Admitting: Infectious Diseases

## 2024-06-16 DIAGNOSIS — I10 Essential (primary) hypertension: Secondary | ICD-10-CM

## 2024-06-16 DIAGNOSIS — K219 Gastro-esophageal reflux disease without esophagitis: Secondary | ICD-10-CM

## 2024-07-03 ENCOUNTER — Ambulatory Visit
Admission: RE | Admit: 2024-07-03 | Discharge: 2024-07-03 | Disposition: A | Discharge status: Home / Self Care | Payer: Self-pay | Source: Ambulatory Visit | Attending: Infectious Diseases | Admitting: Infectious Diseases

## 2024-07-03 DIAGNOSIS — K219 Gastro-esophageal reflux disease without esophagitis: Secondary | ICD-10-CM | POA: Insufficient documentation

## 2024-07-03 DIAGNOSIS — I10 Essential (primary) hypertension: Secondary | ICD-10-CM | POA: Insufficient documentation
